# Patient Record
Sex: Female | Born: 1955
Health system: Southern US, Community
[De-identification: ages and names within clinical notes are randomized; demographics above are authoritative.]

## PROBLEM LIST (undated history)

## (undated) DIAGNOSIS — K635 Polyp of colon: Secondary | ICD-10-CM

## (undated) DIAGNOSIS — G43909 Migraine, unspecified, not intractable, without status migrainosus: Secondary | ICD-10-CM

## (undated) DIAGNOSIS — L718 Other rosacea: Secondary | ICD-10-CM

## (undated) DIAGNOSIS — E785 Hyperlipidemia, unspecified: Secondary | ICD-10-CM

## (undated) DIAGNOSIS — Z8349 Family history of other endocrine, nutritional and metabolic diseases: Secondary | ICD-10-CM

## (undated) DIAGNOSIS — E876 Hypokalemia: Secondary | ICD-10-CM

## (undated) DIAGNOSIS — K760 Fatty (change of) liver, not elsewhere classified: Secondary | ICD-10-CM

## (undated) DIAGNOSIS — I10 Essential (primary) hypertension: Secondary | ICD-10-CM

## (undated) DIAGNOSIS — R002 Palpitations: Secondary | ICD-10-CM

## (undated) HISTORY — DX: Family history of other endocrine, nutritional and metabolic diseases: Z83.49

## (undated) HISTORY — DX: Polyp of colon: K63.5

## (undated) HISTORY — DX: Palpitations: R00.2

## (undated) HISTORY — DX: Other rosacea: L71.8

## (undated) HISTORY — DX: Hyperlipidemia, unspecified: E78.5

## (undated) HISTORY — PX: GALLBLADDER SURGERY: SHX652

## (undated) HISTORY — PX: WISDOM TOOTH EXTRACTION: SHX21

## (undated) HISTORY — DX: Hypokalemia: E87.6

## (undated) HISTORY — PX: TONSILLECTOMY AND ADENOIDECTOMY: SUR1326

## (undated) HISTORY — DX: Fatty (change of) liver, not elsewhere classified: K76.0

## (undated) HISTORY — PX: OTHER SURGICAL HISTORY: SHX169

## (undated) HISTORY — DX: Essential (primary) hypertension: I10

## (undated) HISTORY — DX: Migraine, unspecified, not intractable, without status migrainosus: G43.909

---

## 2011-12-31 ENCOUNTER — Emergency Department: Payer: Self-pay | Admitting: Emergency Medicine

## 2011-12-31 LAB — CBC
HCT: 37.2 % (ref 35.0–47.0)
HGB: 12.7 g/dL (ref 12.0–16.0)
RBC: 4.16 10*6/uL (ref 3.80–5.20)
RDW: 13.2 % (ref 11.5–14.5)
WBC: 8.7 10*3/uL (ref 3.6–11.0)

## 2011-12-31 LAB — COMPREHENSIVE METABOLIC PANEL
Calcium, Total: 8.9 mg/dL (ref 8.5–10.1)
Chloride: 106 mmol/L (ref 98–107)
Co2: 27 mmol/L (ref 21–32)
Creatinine: 0.85 mg/dL (ref 0.60–1.30)
EGFR (Non-African Amer.): >60
Sodium: 139 mmol/L (ref 136–145)

## 2011-12-31 LAB — TROPONIN I: Troponin-I: <0.02 ng/mL

## 2011-12-31 LAB — CK TOTAL AND CKMB (NOT AT ARMC): CK, Total: 117 U/L (ref 21–215)

## 2013-06-19 ENCOUNTER — Ambulatory Visit: Payer: Self-pay | Admitting: Podiatry

## 2013-06-19 ENCOUNTER — Encounter: Payer: Self-pay | Admitting: *Deleted

## 2013-06-29 ENCOUNTER — Ambulatory Visit: Payer: BC Managed Care – PPO | Admitting: Podiatry

## 2014-02-07 ENCOUNTER — Ambulatory Visit: Payer: BC Managed Care – PPO | Admitting: Podiatry

## 2014-03-07 ENCOUNTER — Ambulatory Visit: Payer: Self-pay | Admitting: Family Medicine

## 2014-03-28 LAB — HM COLONOSCOPY

## 2014-05-14 ENCOUNTER — Encounter: Payer: Self-pay | Admitting: Podiatry

## 2014-05-14 ENCOUNTER — Ambulatory Visit (INDEPENDENT_AMBULATORY_CARE_PROVIDER_SITE_OTHER): Payer: No Typology Code available for payment source | Admitting: Podiatry

## 2014-05-14 ENCOUNTER — Ambulatory Visit (INDEPENDENT_AMBULATORY_CARE_PROVIDER_SITE_OTHER): Payer: No Typology Code available for payment source

## 2014-05-14 VITALS — BP 161/109 | HR 72 | Resp 16 | Ht 67.0 in | Wt 175.0 lb

## 2014-05-14 DIAGNOSIS — M722 Plantar fascial fibromatosis: Secondary | ICD-10-CM

## 2014-05-14 MED ORDER — METHYLPREDNISOLONE (PAK) 4 MG PO TABS: ORAL_TABLET | ORAL | Status: DC

## 2014-05-14 MED ORDER — DICLOFENAC SODIUM 75 MG PO TBEC: 75.0000 mg | DELAYED_RELEASE_TABLET | Freq: Two times a day (BID) | ORAL | Status: DC

## 2014-05-14 NOTE — Progress Notes (Signed)
She presents today complaining of painful right heel. It is been bothered her since July and after she returns from [place] its has seemed to progressively get worse. I have reviewed her past medical history medications allergies surgeries social history. She is done nothing for it.  Objective: Vital signs are stable alert and oriented x3. She has pain on palpation medial calcaneal tubercle of the right heel. Pulses remain palpable. Areas warm to the touch. Radiographic evaluation demonstrates a plantar distally oriented calcaneal heel spur with soft tissue increase in density at the plantar fascial calcaneal insertion site of the right heel. Consistent with plantar fasciitis.  Assessment: Plantar fasciitis right foot.  Plan: Discussed etiology pathology conservative versus surgical therapies. Injected the right heel. She will wear her night splint on the right foot. Dispensed a plantar fascial brace. Discussed appropriate shoe gear stretching exercises and ice therapy. We also dispensed a prescription for Medrol Dosepak to be followed by her diclofenac. I will followup with her in one month.

## 2014-06-13 ENCOUNTER — Encounter: Payer: Self-pay | Admitting: Podiatry

## 2014-06-13 ENCOUNTER — Ambulatory Visit (INDEPENDENT_AMBULATORY_CARE_PROVIDER_SITE_OTHER): Payer: No Typology Code available for payment source | Admitting: Podiatry

## 2014-06-13 DIAGNOSIS — M722 Plantar fascial fibromatosis: Secondary | ICD-10-CM

## 2014-06-13 NOTE — Progress Notes (Signed)
She presents today for followup of her plantar fasciitis of her right heel.  Objective: Vital signs are stable she is alert and oriented x3 he still has tenderness on palpation medial calcaneal tubercle of the right heel. She has more tenderness now on these the insertion site of the tendo Achilles and posterior aspect of the heel as it inserts posterior and inferior.  Assessment: Plantar fasciitis with compensatory tendo Achilles tendinitis right.  Plan: Continue all conservative therapies we dispensed a plantar fascial brace and a night splint. Injected her right heel once again today and she will continue diclofenac.

## 2014-06-18 ENCOUNTER — Ambulatory Visit: Payer: No Typology Code available for payment source | Admitting: Podiatry

## 2014-07-02 ENCOUNTER — Telehealth: Payer: Self-pay | Admitting: *Deleted

## 2014-07-02 ENCOUNTER — Other Ambulatory Visit: Payer: Self-pay | Admitting: *Deleted

## 2014-07-02 MED ORDER — METHYLPREDNISOLONE (PAK) 4 MG PO TABS: ORAL_TABLET | ORAL | Status: DC

## 2014-07-02 NOTE — Telephone Encounter (Signed)
Patient is going to [place] would like prednisone ,ok per hyatt

## 2014-07-02 NOTE — Telephone Encounter (Signed)
Patient is going away to [place] wanted a refill on prednisone. Ok per dr Milinda Pointer

## 2014-07-30 ENCOUNTER — Ambulatory Visit (INDEPENDENT_AMBULATORY_CARE_PROVIDER_SITE_OTHER): Payer: No Typology Code available for payment source | Admitting: Podiatry

## 2014-07-30 VITALS — BP 151/91 | HR 73 | Resp 16

## 2014-07-30 DIAGNOSIS — M722 Plantar fascial fibromatosis: Secondary | ICD-10-CM

## 2014-07-30 NOTE — Progress Notes (Signed)
He presents today for follow-up of her plantar fasciitis to her right heel. She states that the majority of the foot is doing well except it hurts right ear and she points to the posterior lateral aspect of the right heel. She denies any trauma.  Objective: Vital signs stable she is alert and oriented 3 she has pain on posterior lateral aspect of the right heel.  Assessment plantar fasciitis insertional Achilles tendinitis right.  Plan: Injected dexamethasone to the point of maximal tenderness today and she was scanned for set of orthotics.

## 2014-08-22 ENCOUNTER — Ambulatory Visit: Payer: No Typology Code available for payment source | Admitting: Podiatry

## 2014-10-15 ENCOUNTER — Ambulatory Visit (INDEPENDENT_AMBULATORY_CARE_PROVIDER_SITE_OTHER): Payer: No Typology Code available for payment source | Admitting: *Deleted

## 2014-10-15 DIAGNOSIS — M722 Plantar fascial fibromatosis: Secondary | ICD-10-CM

## 2014-10-15 NOTE — Progress Notes (Signed)
Orthotics picked up. Gradual breakin discussed.

## 2014-10-15 NOTE — Patient Instructions (Signed)

## 2014-11-14 ENCOUNTER — Ambulatory Visit: Payer: No Typology Code available for payment source | Admitting: Podiatry

## 2015-01-29 ENCOUNTER — Ambulatory Visit: Payer: Self-pay | Admitting: Family Medicine

## 2015-02-04 ENCOUNTER — Ambulatory Visit (INDEPENDENT_AMBULATORY_CARE_PROVIDER_SITE_OTHER): Payer: No Typology Code available for payment source | Admitting: Family Medicine

## 2015-02-04 ENCOUNTER — Encounter: Payer: Self-pay | Admitting: Family Medicine

## 2015-02-04 ENCOUNTER — Encounter (INDEPENDENT_AMBULATORY_CARE_PROVIDER_SITE_OTHER): Payer: Self-pay

## 2015-02-04 VITALS — BP 130/90 | HR 79 | Temp 98.9°F | Ht 67.0 in | Wt 180.8 lb

## 2015-02-04 DIAGNOSIS — M722 Plantar fascial fibromatosis: Secondary | ICD-10-CM | POA: Diagnosis not present

## 2015-02-04 DIAGNOSIS — I10 Essential (primary) hypertension: Secondary | ICD-10-CM | POA: Diagnosis not present

## 2015-02-04 DIAGNOSIS — Z8489 Family history of other specified conditions: Secondary | ICD-10-CM | POA: Diagnosis not present

## 2015-02-04 DIAGNOSIS — Z8349 Family history of other endocrine, nutritional and metabolic diseases: Secondary | ICD-10-CM

## 2015-02-04 DIAGNOSIS — L718 Other rosacea: Secondary | ICD-10-CM

## 2015-02-04 DIAGNOSIS — Z7189 Other specified counseling: Secondary | ICD-10-CM

## 2015-02-04 NOTE — Patient Instructions (Addendum)
Schedule a fasting lab appointment.   Take care.  Glad to see you.   I'll look back through your old records in the meantime.

## 2015-02-04 NOTE — Progress Notes (Signed)
Pre visit review using our clinic review tool, if applicable. No additional management support is needed unless otherwise documented below in the visit note.  New pt.    Hypertension:    Using medication without problems or lightheadedness: yes Chest pain with exertion:no Edema:no Short of breath:no Intolerant of diuretics.   No ADE on current med.  D/w pt about diet and exercise.  Outside labs noted, due for lipids.    H/o ocular rosaea, responded to doxy prev.  Off med currently.   FH of B12 def, occ word searching noted.  Asking about getting B12 checked.  This is reasonable.  D/w pt.   Meds, vitals, and allergies reviewed.   PMH and SH reviewed  ROS: See HPI.  Otherwise negative.    GEN: nad, alert and oriented HEENT: mucous membranes moist NECK: supple w/o LA CV: rrr. PULM: ctab, no inc wob ABD: soft, +bs EXT: no edema SKIN: no acute rash

## 2015-02-05 ENCOUNTER — Encounter: Payer: Self-pay | Admitting: Family Medicine

## 2015-02-05 DIAGNOSIS — I10 Essential (primary) hypertension: Secondary | ICD-10-CM | POA: Insufficient documentation

## 2015-02-05 DIAGNOSIS — Z7189 Other specified counseling: Secondary | ICD-10-CM | POA: Insufficient documentation

## 2015-02-05 DIAGNOSIS — M722 Plantar fascial fibromatosis: Secondary | ICD-10-CM | POA: Insufficient documentation

## 2015-02-05 DIAGNOSIS — L718 Other rosacea: Secondary | ICD-10-CM | POA: Insufficient documentation

## 2015-02-05 DIAGNOSIS — Z8349 Family history of other endocrine, nutritional and metabolic diseases: Secondary | ICD-10-CM | POA: Insufficient documentation

## 2015-02-05 NOTE — Assessment & Plan Note (Signed)
Return for labs.  She agrees.  Reasonable to check.

## 2015-02-05 NOTE — Assessment & Plan Note (Addendum)
Controlled, continue as is.  Will review available outside labs and records for other health maintenance items.  D/w pt.   Intolerant of diuretics.   No ADE on current med.  D/w pt about diet and exercise.  Outside labs noted, due for lipids.   >30 minutes spent in face to face time with patient, >50% spent in counselling or coordination of care

## 2015-02-07 ENCOUNTER — Other Ambulatory Visit (INDEPENDENT_AMBULATORY_CARE_PROVIDER_SITE_OTHER): Payer: No Typology Code available for payment source

## 2015-02-07 DIAGNOSIS — Z8489 Family history of other specified conditions: Secondary | ICD-10-CM

## 2015-02-07 DIAGNOSIS — I10 Essential (primary) hypertension: Secondary | ICD-10-CM | POA: Diagnosis not present

## 2015-02-07 DIAGNOSIS — Z8349 Family history of other endocrine, nutritional and metabolic diseases: Secondary | ICD-10-CM

## 2015-02-07 LAB — LIPID PANEL
CHOLESTEROL: 218 mg/dL — AB (ref 0–200)
HDL: 49.8 mg/dL (ref >39.00)
LDL CALC: 137 mg/dL — AB (ref 0–99)
NonHDL: 168.2
Total CHOL/HDL Ratio: 4
Triglycerides: 157 mg/dL — ABNORMAL HIGH (ref 0.0–149.0)
VLDL: 31.4 mg/dL (ref 0.0–40.0)

## 2015-02-07 LAB — VITAMIN B12: VITAMIN B 12: 1393 pg/mL — AB (ref 211–911)

## 2015-02-11 ENCOUNTER — Ambulatory Visit (INDEPENDENT_AMBULATORY_CARE_PROVIDER_SITE_OTHER): Payer: No Typology Code available for payment source | Admitting: Family Medicine

## 2015-02-11 DIAGNOSIS — Z8349 Family history of other endocrine, nutritional and metabolic diseases: Secondary | ICD-10-CM

## 2015-02-11 DIAGNOSIS — Z8489 Family history of other specified conditions: Secondary | ICD-10-CM

## 2015-02-12 NOTE — Progress Notes (Signed)
Opened in error

## 2015-02-26 ENCOUNTER — Telehealth: Payer: Self-pay

## 2015-02-26 NOTE — Telephone Encounter (Signed)
Please get update on patient.  Thanks. 

## 2015-02-26 NOTE — Telephone Encounter (Signed)
Do not see where pt was seen at [facility] Sat clinic.

## 2015-02-26 NOTE — Telephone Encounter (Signed)
No answer, no VM available.  Will try again later. 

## 2015-02-26 NOTE — Telephone Encounter (Signed)
No answer, no VM available.

## 2015-02-26 NOTE — Telephone Encounter (Signed)
PLEASE NOTE: All timestamps contained within this report are represented as [place] Standard Time. CONFIDENTIALTY NOTICE: This fax transmission is intended only for the addressee. It contains information that is legally privileged, confidential or otherwise protected from use or disclosure. If you are not the intended recipient, you are strictly prohibited from reviewing, disclosing, copying using or disseminating any of this information or taking any action in reliance on or regarding this information. If you have received this fax in error, please notify us immediately by telephone so that we can arrange for its return to [place]. Phone: [phone], Toll-Free: [phone], Fax: [phone] Page: 1 of 2 Call Id: [id number] Clarence Patient Name: Yolanda Delgado Gender: Female DOB: 1956-07-10 Age: 59 Y 10 M 13 D Return Phone Number: [phone] (Primary) Address: City/State/Zip: [place] [address] Client [facility] - Client Client Site [place] Physician Renford Dills Contact Type Call Call Type Triage / Clinical Relationship To Patient Self Return Phone Number [phone] (Primary) Chief Complaint Tick Bite Initial Comment Caller states she had a tick on her and there is a swollen red area around it. PreDisposition Call Doctor Nurse Assessment Nurse: Leilani Merl, RN, Nira Conn Date/Time Eilene Ghazi Time): 02/23/2015 9:43:54 AM Confirm and document reason for call. If symptomatic, describe symptoms. ---Caller states she had a tick on her, that might have been there for about a week, her husband removed it this morning and there is a swollen red area around it that is about a nickel size Has the patient traveled out of the country within the last 30 days? ---Not Applicable Does the patient require triage? ---Yes Related visit to physician  within the last 2 weeks? ---No Does the PT have any chronic conditions? (i.e. diabetes, asthma, etc.) ---Unknown Guidelines Guideline Title Affirmed Question Affirmed Notes Nurse Date/Time (Eastern Time) Tick Bite [1] Probable deer tick AND [2] attached > 24 hours (or tick appears swollen, not flat) AND [3] occurred in an area where Lyme disease is common Standifer, RN, Heather 02/23/2015 9:45:43 AM Disp. Time Eilene Ghazi Time) Disposition Final User 02/23/2015 9:54:40 AM Call Completed Standifer, RN, Nira Conn 02/23/2015 9:53:22 AM Call PCP within 24 Hours Yes Standifer, RN, Soyla Murphy Understands: Yes Disagree/Comply: Comply PLEASE NOTE: All timestamps contained within this report are represented as [place] Standard Time. CONFIDENTIALTY NOTICE: This fax transmission is intended only for the addressee. It contains information that is legally privileged, confidential or otherwise protected from use or disclosure. If you are not the intended recipient, you are strictly prohibited from reviewing, disclosing, copying using or disseminating any of this information or taking any action in reliance on or regarding this information. If you have received this fax in error, please notify us immediately by telephone so that we can arrange for its return to [place]. Phone: [phone], Toll-Free: [phone], Fax: [phone] Page: 2 of 2 Call Id: [id number] Care Advice Given Per Guideline CALL PCP WITHIN 24 HOURS: You need to discuss this with your doctor within the next 24 hours. * You become worse. CALL BACK IF: CARE ADVICE given per Tick Bites (Adult) guideline. * Fever or rash occur in the next 2 weeks After Care Instructions Given Call Event Type User Date / Time Description Referrals [facility]

## 2015-02-27 NOTE — Telephone Encounter (Signed)
Spoke to patient and was advised that she went to [facility] in [place]. Patient stated that they gave her Doxycyline 100 mg twice a day for 14 days.  Patient stated that she is doing better and they think that it was a lone star tick instead of a deer tick. Patient stated that the tick may have been in for at least 5 days. Advised patient that if she does not continue to improve or develops any symptoms to call for an appointment to be seen. Patient agreed.

## 2015-02-27 NOTE — Telephone Encounter (Signed)
Thanks

## 2016-11-16 DIAGNOSIS — H524 Presbyopia: Secondary | ICD-10-CM | POA: Diagnosis not present

## 2017-02-03 ENCOUNTER — Telehealth: Payer: Self-pay

## 2017-02-03 ENCOUNTER — Encounter: Payer: Self-pay | Admitting: Family Medicine

## 2017-02-03 ENCOUNTER — Ambulatory Visit (INDEPENDENT_AMBULATORY_CARE_PROVIDER_SITE_OTHER): Payer: 59 | Admitting: Family Medicine

## 2017-02-03 VITALS — BP 170/110 | HR 74 | Temp 98.8°F | Wt 184.2 lb

## 2017-02-03 DIAGNOSIS — Z8349 Family history of other endocrine, nutritional and metabolic diseases: Secondary | ICD-10-CM

## 2017-02-03 DIAGNOSIS — I10 Essential (primary) hypertension: Secondary | ICD-10-CM | POA: Diagnosis not present

## 2017-02-03 DIAGNOSIS — G43909 Migraine, unspecified, not intractable, without status migrainosus: Secondary | ICD-10-CM | POA: Diagnosis not present

## 2017-02-03 MED ORDER — IRBESARTAN 150 MG PO TABS: 150.0000 mg | ORAL_TABLET | Freq: Every day | ORAL | 3 refills | Status: DC

## 2017-02-03 MED ORDER — SUMATRIPTAN SUCCINATE 50 MG PO TABS: 50.0000 mg | ORAL_TABLET | Freq: Every day | ORAL | 3 refills | Status: DC | PRN

## 2017-02-03 MED ORDER — ATORVASTATIN CALCIUM 10 MG PO TABS: 10.0000 mg | ORAL_TABLET | Freq: Every day | ORAL | 3 refills | Status: DC

## 2017-02-03 MED ORDER — AMLODIPINE BESYLATE 2.5 MG PO TABS: 2.5000 mg | ORAL_TABLET | Freq: Every day | ORAL | 3 refills | Status: DC

## 2017-02-03 MED ORDER — AMLODIPINE-ATORVASTATIN 2.5-10 MG PO TABS: 1.0000 | ORAL_TABLET | Freq: Every day | ORAL | 3 refills | Status: DC

## 2017-02-03 NOTE — Progress Notes (Signed)
Hypertension:    Using medication without problems or lightheadedness: yes Chest pain with exertion:no Edema:no Short of breath:no She is out of meds.   She has tolerated ARB w/o complication.   Due for labs. D/w pt.   Usually BP controlled, systolic 507 or lower.    FH b12 def, off replacement.  She doesn't have known personal hx.    Migraines- avoiding tylenol.  imitrex helped some.  Used prn.  No ADE on med.   She has had recurrent trouble with right hip bursitis and I asked her to speak with the front staff about seeing Dr. Lorelei Pont.   PMH and SH reviewed  ROS: Per HPI unless specifically indicated in ROS section   Meds, vitals, and allergies reviewed.   GEN: nad, alert and oriented HEENT: mucous membranes moist NECK: supple w/o LA CV: rrr. PULM: ctab, no inc wob ABD: soft, +bs EXT: no edema SKIN: no acute rash

## 2017-02-03 NOTE — Telephone Encounter (Signed)
Pt is at [facility] now; pt request cb ASAP; pts BP is high and she does not have amlodipine to take this evening. Pt said can split med back up again. I advised was not sure if PA would need to be done or how will be resolved. Pt does not want to go without med this evening.Please advise.

## 2017-02-03 NOTE — Telephone Encounter (Signed)
Patient notified by telephone that scripts were sent in per Dr. Damita Dunnings.

## 2017-02-03 NOTE — Telephone Encounter (Signed)
Sent as separate rxs.

## 2017-02-03 NOTE — Patient Instructions (Addendum)
Go to the lab on the way out.  We'll contact you with your lab report. Take care.  Glad to see you.  Update me if your BP isn't controlled when back on the meds, goal <140/<90.

## 2017-02-04 LAB — LIPID PANEL
Cholesterol: 250 mg/dL — ABNORMAL HIGH (ref 0–200)
HDL: 50.1 mg/dL (ref >39.00)
NonHDL: 199.69
TRIGLYCERIDES: 215 mg/dL — AB (ref 0.0–149.0)
Total CHOL/HDL Ratio: 5
VLDL: 43 mg/dL — ABNORMAL HIGH (ref 0.0–40.0)

## 2017-02-04 LAB — LDL CHOLESTEROL, DIRECT: LDL DIRECT: 164 mg/dL

## 2017-02-04 LAB — COMPREHENSIVE METABOLIC PANEL
ALK PHOS: 60 U/L (ref 39–117)
ALT: 46 U/L — AB (ref 0–35)
AST: 25 U/L (ref 0–37)
Albumin: 4.7 g/dL (ref 3.5–5.2)
BILIRUBIN TOTAL: 1.2 mg/dL (ref 0.2–1.2)
BUN: 23 mg/dL (ref 6–23)
CALCIUM: 10.1 mg/dL (ref 8.4–10.5)
CO2: 27 meq/L (ref 19–32)
Chloride: 104 mEq/L (ref 96–112)
Creatinine, Ser: 0.8 mg/dL (ref 0.40–1.20)
GFR: 78.08 mL/min (ref >60.00)
Glucose, Bld: 81 mg/dL (ref 70–99)
Potassium: 4.2 mEq/L (ref 3.5–5.1)
Sodium: 140 mEq/L (ref 135–145)
TOTAL PROTEIN: 7.4 g/dL (ref 6.0–8.3)

## 2017-02-04 LAB — VITAMIN B12: Vitamin B-12: 401 pg/mL (ref 211–911)

## 2017-02-05 DIAGNOSIS — G43909 Migraine, unspecified, not intractable, without status migrainosus: Secondary | ICD-10-CM | POA: Insufficient documentation

## 2017-02-05 NOTE — Assessment & Plan Note (Signed)
Per patient, had been controlled. Due for labs. See notes on labs. Rationale for current medications discussed with patient. Reasonable to restart medication and have her update me if her blood pressure not controlled. Discussed with patient. >25 minutes spent in face to face time with patient, >50% spent in counselling or coordination of care.

## 2017-02-05 NOTE — Assessment & Plan Note (Signed)
See notes on labs. Not on replacement currently.

## 2017-02-05 NOTE — Assessment & Plan Note (Signed)
Is avoiding tylenol to limit rebound HA.  imitrex helped some.  Used prn.  No ADE on med. Continue prn use, as is.

## 2017-02-07 ENCOUNTER — Other Ambulatory Visit: Payer: Self-pay | Admitting: Family Medicine

## 2017-02-07 ENCOUNTER — Encounter: Payer: Self-pay | Admitting: Family Medicine

## 2017-02-07 DIAGNOSIS — E785 Hyperlipidemia, unspecified: Secondary | ICD-10-CM | POA: Insufficient documentation

## 2017-02-22 ENCOUNTER — Ambulatory Visit: Payer: 59 | Admitting: Family Medicine

## 2017-03-10 ENCOUNTER — Encounter: Payer: Self-pay | Admitting: Family Medicine

## 2017-03-10 ENCOUNTER — Ambulatory Visit (INDEPENDENT_AMBULATORY_CARE_PROVIDER_SITE_OTHER): Payer: 59 | Admitting: Family Medicine

## 2017-03-10 VITALS — BP 140/90 | HR 74 | Temp 99.0°F | Ht 66.73 in | Wt 183.0 lb

## 2017-03-10 DIAGNOSIS — M545 Low back pain, unspecified: Secondary | ICD-10-CM

## 2017-03-10 DIAGNOSIS — M7061 Trochanteric bursitis, right hip: Secondary | ICD-10-CM

## 2017-03-10 DIAGNOSIS — M722 Plantar fascial fibromatosis: Secondary | ICD-10-CM

## 2017-03-10 DIAGNOSIS — M25372 Other instability, left ankle: Secondary | ICD-10-CM

## 2017-03-10 DIAGNOSIS — G8929 Other chronic pain: Secondary | ICD-10-CM

## 2017-03-10 NOTE — Progress Notes (Signed)
Dr. Frederico Hamman T. Fariha Goto, MD, [facility] [place], [address] Phone: [phone] Fax: [phone]  03/10/2017  Patient: Yolanda Delgado, MRN: [id number], DOB: 1956-08-10, 61 y.o.  Primary Physician:  Tonia Ghent, MD   Chief Complaint  Patient presents with  . Hip Pain    Right  . Back Pain    thinks it's muscular  . Plantar Fasciitis    history of both feet at two different times   Subjective:   Yolanda Delgado is a 61 y.o. very pleasant female patient who presents with the following:  Patient presents primarily with lateral hip pain on the right, but she has multiple other musculoskeletal complaints.  4-5 yrs ago, PF on the L, then had troch bursitis - has had several injections for the last 3-4 years. At the moment it is not hurting. Takes some diclofenac for several days.   Prior to this, for 5 years ago she was very active and can walk, swim, and hike routinely. Now she is fairly limited. She also has intermittent back pain in the low back with walking, standing, and sitting. She does not have any numbness, tingling or radicular symptoms.  She denies any groin pain.  She also had plantar fasciitis in both the left and right foot intermittently, but they are improved at this point. She also has currently some pain in the ankle joint on the left.  Walking, swimming will get pain laterrally   Past Medical History, Surgical History, Social History, Family History, Problem List, Medications, and Allergies have been reviewed and updated if relevant.  Patient Active Problem List   Diagnosis Date Noted  . HLD (hyperlipidemia) 02/07/2017  . Migraine 02/05/2017  . HTN (hypertension) 02/05/2015  . Plantar fasciitis 02/05/2015  . Ocular rosacea 02/05/2015  . Advance care planning 02/05/2015    Past Medical History:  Diagnosis Date  . Colon polyp   . Family history of B12 deficiency   . Hypertension   .  Hypokalemia    with diuretic use  . Migraine   . Ocular rosacea   . Palpitation    due to caffeine    Past Surgical History:  Procedure Laterality Date  . GALLBLADDER SURGERY    . TONSILLECTOMY AND ADENOIDECTOMY    . UTERINE ABLATION    . WISDOM TOOTH EXTRACTION      Social History   Social History  . Marital status: Married    Spouse name: N/A  . Number of children: N/A  . Years of education: N/A   Occupational History  . Not on file.   Social History Main Topics  . Smoking status: Never Smoker  . Smokeless tobacco: Never Used  . Alcohol use No  . Drug use: No  . Sexual activity: Not on file   Other Topics Concern  . 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 Renal cancer Mother   . Breast cancer Mother   . Cancer Mother   . Stroke Father     Allergies  Allergen Reactions  . Ace Inhibitors Other (See Comments)    H/o leg cramping with ACE use.   Marland Kitchen Hydrochlorothiazide Other (See Comments)    Risk of hypokalemia- prev hospitalized.    Marland Kitchen  Lasix [Furosemide] Other (See Comments)    Risk of hypokalemia- prev hospitalized.  . Pamabrom Other (See Comments)    Low potassium  . Aspirin Other (See Comments)    Local swelling at the temples, but can tolerate ibuprofen and diclofenac    Medication list reviewed and updated in full in [facility].  GEN: No fevers, chills. Nontoxic. Primarily MSK c/o today. MSK: Detailed in the HPI GI: tolerating PO intake without difficulty Neuro: No numbness, parasthesias, or tingling associated. Otherwise the pertinent positives of the ROS are noted above.   Objective:   BP 140/90   Pulse 74   Temp 99 F (37.2 C) (Oral)   Ht 5' 6.73" (1.695 m)   Wt 183 lb (83 kg)   BMI 28.89 kg/m    GEN: WDWN, NAD, Non-toxic, Alert & Oriented x 3 HEENT:  Atraumatic, Normocephalic.  Ears and Nose: No external deformity. EXTR: No clubbing/cyanosis/edema NEURO: Normal gait.  PSYCH: Normally interactive. Conversant. Not depressed or anxious appearing.  Calm demeanor.   HIP EXAM: SIDE: B ROM: Abduction, Flexion, Internal and External range of motion: full Pain with terminal IROM and EROM: none GTB: NT SLR: NEG Knees: No effusion FABER: NT REVERSE FABER: NT, neg Piriformis: NT at direct palpation Str: flexion: 4-/5 abduction: 3/5 on R and 4+/5 on L adduction: 3+/5 Strength testing non-tender  Left ankle has some tenderness in the true ankle joint above the talus. She also has some increased laxity on her anterior drawer testing. Subtalar tilt test is normal.  Nontender throughout the forefoot and midfoot.  She also has some weakness in dorsiflexion as well as inversion and eversion on the foot and ankle. Toe extension and plantar flexion are 5/5.  Radiology: No results found.  Assessment and Plan:   Trochanteric bursitis, right hip - Plan: Ambulatory referral to Physical Therapy  Chronic bilateral low back pain without sciatica - Plan: Ambulatory referral to Physical Therapy  Left ankle instability - Plan: Ambulatory referral to Physical Therapy  Plantar fasciitis, bilateral  Globally she is quite weak, particularly in her pelvic support structure. Without stabilizing this and increasing her strength she will have recurrent trochanteric bursitis and other issues around the hip.  Left ankle is modestly unstable. Decreased strength. Today with some inflammation in the ankle joint itself.  I have given her a fairly thorough hip rehabilitation program as well as some ankle proprioceptive exercises.  Formal physical therapy I think will be very helpful in decreasing further risk for recurrent pain and injury.  Follow-up: Return in about 6 weeks (around 04/21/2017).  Future Appointments Date Time Provider [place]    04/28/2017 11:00 AM Owens Loffler, MD [facility]  05/11/2017 10:30 AM [facility]   Orders Placed This Encounter  Procedures  . Ambulatory referral to Physical Therapy    Signed,  Frederico Hamman T. Malasha Kleppe, MD   Allergies as of 03/10/2017      Reactions   Ace Inhibitors Other (See Comments)   H/o leg cramping with ACE use.    Hydrochlorothiazide Other (See Comments)   Risk of hypokalemia- prev hospitalized.     Lasix [furosemide] Other (See Comments)   Risk of hypokalemia- prev hospitalized.   Pamabrom Other (See Comments)   Low potassium   Aspirin Other (See Comments)   Local swelling at the temples, but can tolerate ibuprofen and diclofenac      Medication List       Accurate as of 03/10/17  1:40 PM. Always  use your most recent med list.          amLODipine 2.5 MG tablet Commonly known as:  NORVASC Take 1 tablet (2.5 mg total) by mouth daily.   atorvastatin 10 MG tablet Commonly known as:  LIPITOR Take 1 tablet (10 mg total) by mouth daily.   diclofenac 75 MG EC tablet Commonly known as:  VOLTAREN as needed.   ESTROVEN PO Take by mouth daily.   Ginger Root 250 MG Caps Takes 1 capsules daily   irbesartan 150 MG tablet Commonly known as:  AVAPRO Take 1 tablet (150 mg total) by mouth daily.   NON FORMULARY Sea [place]orn Oil daily.   SUMAtriptan 50 MG tablet Commonly known as:  IMITREX Take 1 tablet (50 mg total) by mouth daily as needed for migraine. May repeat in 2 hours if headache persists or recurs.   TURMERIC PO Take by mouth daily.

## 2017-03-15 ENCOUNTER — Telehealth: Payer: Self-pay

## 2017-03-15 NOTE — Telephone Encounter (Signed)
Refaxed orders to [place] office.

## 2017-03-15 NOTE — Telephone Encounter (Signed)
Pt was referred to them and they did not receive a demographics sheet or script for PT. She has an appt tomorrow and needs it faxed to [phone].

## 2017-03-16 DIAGNOSIS — M25551 Pain in right hip: Secondary | ICD-10-CM | POA: Diagnosis not present

## 2017-03-16 DIAGNOSIS — M7061 Trochanteric bursitis, right hip: Secondary | ICD-10-CM | POA: Diagnosis not present

## 2017-03-18 DIAGNOSIS — M25551 Pain in right hip: Secondary | ICD-10-CM | POA: Diagnosis not present

## 2017-03-18 DIAGNOSIS — M7061 Trochanteric bursitis, right hip: Secondary | ICD-10-CM | POA: Diagnosis not present

## 2017-03-22 ENCOUNTER — Telehealth: Payer: Self-pay | Admitting: *Deleted

## 2017-03-22 DIAGNOSIS — M25551 Pain in right hip: Secondary | ICD-10-CM | POA: Diagnosis not present

## 2017-03-22 DIAGNOSIS — M7061 Trochanteric bursitis, right hip: Secondary | ICD-10-CM | POA: Diagnosis not present

## 2017-03-22 NOTE — Telephone Encounter (Signed)
Patient left a voicemail stating that she is calling to let you know that her blood pressure is not getting below 140/90-95. Patient stated that she does not know if you want her to do something else about her BP or not? Pharmacy [facility]

## 2017-03-23 ENCOUNTER — Ambulatory Visit (INDEPENDENT_AMBULATORY_CARE_PROVIDER_SITE_OTHER): Payer: 59 | Admitting: Primary Care

## 2017-03-23 ENCOUNTER — Other Ambulatory Visit: Payer: Self-pay | Admitting: *Deleted

## 2017-03-23 ENCOUNTER — Encounter: Payer: Self-pay | Admitting: Primary Care

## 2017-03-23 VITALS — BP 152/98 | HR 84 | Temp 98.7°F | Wt 181.8 lb

## 2017-03-23 DIAGNOSIS — J029 Acute pharyngitis, unspecified: Secondary | ICD-10-CM | POA: Diagnosis not present

## 2017-03-23 LAB — POCT RAPID STREP A (OFFICE): Rapid Strep A Screen: NEGATIVE

## 2017-03-23 MED ORDER — AMLODIPINE BESYLATE 5 MG PO TABS: 5.0000 mg | ORAL_TABLET | Freq: Every day | ORAL | 11 refills | Status: DC

## 2017-03-23 NOTE — Telephone Encounter (Signed)
Patient advised.  New Rx sent.

## 2017-03-23 NOTE — Patient Instructions (Signed)
Your symptoms are representative of a viral illness which will resolve on its own over time. Our goal is to treat your symptoms in order to aid your body in the healing process and to make you more comfortable.   Please call me Friday this week if your pain becomes worse, you develop fevers, you notice white spots.  Start Ibuprofen. Take 600 mg three times daily as needed for pain and inflammation.  Try warm salt gargles for pain.  It was a pleasure meeting you!

## 2017-03-23 NOTE — Telephone Encounter (Signed)
Call patient. Verify that she is on amlodipine 2.5 mg a day. If so, increase to 5 mg a day and then update me as needed about her blood pressure, if still above 140/90. Let me know if she needs a new prescription sent. Thanks.

## 2017-03-23 NOTE — Progress Notes (Signed)
Subjective:    Patient ID: Yolanda Delgado, female    DOB: [birth year], 61 y.o.   MRN: [id number]  HPI  Yolanda Delgado is a 61 year old female who presents today with a chief complaint of sore throat. She also reports chills, cough, voice hoarseness. Her symptoms began three days ago. She denies sick contacts, fevers, sinus pressure. She's taken Ricola lozenges, Zicam, Zinc, Vitamin D 3, choloraseptic spray without much improvement.   Review of Systems  Constitutional: Positive for chills. Negative for fever.  HENT: Positive for congestion and sore throat. Negative for sinus pressure.   Respiratory: Positive for cough.      Past Medical History:  Diagnosis Date  . Colon polyp   . Family history of B12 deficiency   . Hypertension   . Hypokalemia    with diuretic use  . Migraine   . Ocular rosacea   . Palpitation    due to caffeine     Social History   Social History  . Marital status: Married    Spouse name: N/A  . Number of children: N/A  . Years of education: N/A   Occupational History  . Not on file.   Social History Main Topics  . Smoking status: Never Smoker  . Smokeless tobacco: Never Used  . Alcohol use No  . Drug use: No  . Sexual activity: Not on file   Other Topics Concern  . 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 GALLBLADDER SURGERY    . TONSILLECTOMY AND ADENOIDECTOMY    . UTERINE ABLATION    . WISDOM TOOTH EXTRACTION      Family History  Problem Relation Age of Onset  . Renal cancer Mother   . Breast cancer Mother   . Cancer Mother   . Stroke Father     Allergies  Allergen Reactions  . Ace Inhibitors Other (See Comments)    H/o leg cramping with ACE use.   Marland Kitchen Hydrochlorothiazide Other (See Comments)    Risk of hypokalemia- prev hospitalized.    . Lasix [Furosemide]  Other (See Comments)    Risk of hypokalemia- prev hospitalized.  . Pamabrom Other (See Comments)    Low potassium  . Aspirin Other (See Comments)    Local swelling at the temples, but can tolerate ibuprofen and diclofenac    Current Outpatient Prescriptions on File Prior to Visit  Medication Sig Dispense Refill  . atorvastatin (LIPITOR) 10 MG tablet Take 1 tablet (10 mg total) by mouth daily. 90 tablet 3  . diclofenac (VOLTAREN) 75 MG EC tablet as needed.     . Ginger, Zingiber officinalis, (GINGER ROOT) 250 MG CAPS Takes 1 capsules daily    . irbesartan (AVAPRO) 150 MG tablet Take 1 tablet (150 mg total) by mouth daily. 90 tablet 3  . NON FORMULARY Sea Buckthorn Oil daily.    . Nutritional Supplements (ESTROVEN PO) Take by mouth daily.    . SUMAtriptan (IMITREX) 50 MG tablet Take 1 tablet (50 mg total) by mouth daily as needed for migraine. May repeat in 2 hours if headache persists or recurs. 10 tablet 3  . TURMERIC PO Take by mouth daily.     No current facility-administered medications on file prior to visit.  BP (!) 152/98   Pulse 84   Temp 98.7 F (37.1 C) (Oral)   Wt 181 lb 12.8 oz (82.5 kg)   SpO2 99%   BMI 28.70 kg/m    Objective:   Physical Exam  Constitutional: She appears well-nourished. She does not appear ill.  HENT:  Right Ear: Tympanic membrane and ear canal normal.  Left Ear: Ear canal normal. Tympanic membrane is retracted. Tympanic membrane is not erythematous.  Nose: Right sinus exhibits no maxillary sinus tenderness and no frontal sinus tenderness. Left sinus exhibits no maxillary sinus tenderness and no frontal sinus tenderness.  Mouth/Throat: Posterior oropharyngeal edema and posterior oropharyngeal erythema present. No oropharyngeal exudate.  Eyes: Conjunctivae are normal.  Neck: Neck supple.  Cardiovascular: Normal rate and regular rhythm.   Pulmonary/Chest: Effort normal and breath sounds normal. She has no wheezes. She has no rales.    Lymphadenopathy:    She has no cervical adenopathy.  Skin: Skin is warm and dry.          Assessment & Plan:  Sore Throat:  Also with congestion, mild cough, chills x 3 days. Little to no improvement with OTC treatment. Rapid Strep: Negative Suspect viral involvement and will treat with supportive measures. Ibuprofen, warm salt gargles. She will update Friday this week if no improvement.  Sheral Flow, NP

## 2017-03-25 ENCOUNTER — Telehealth: Payer: Self-pay

## 2017-03-25 DIAGNOSIS — R05 Cough: Secondary | ICD-10-CM

## 2017-03-25 DIAGNOSIS — R059 Cough, unspecified: Secondary | ICD-10-CM

## 2017-03-25 NOTE — Telephone Encounter (Signed)
Patient says she doesn't really think she needs an x-ray unless it moves down into her chest.  Right now it is mainly her nose, eyes and throat.  Throat has been sore for a week, nasal congestion, productive cough this morning of green phlegm but not since early morning.  Eyes are weeping and swollen.  Eye MD suggested that ABX might be needed.

## 2017-03-25 NOTE — Telephone Encounter (Signed)
Please have her take the eyedrops as prescribed by her optometrist. I still suspect her respiratory symptoms to be viral, see below. We can check a chest x-ray to rule out any potential bacterial involvement, this will give [place] more information. Please have her come in at her convenience. Which cough medication would she like? See below.

## 2017-03-25 NOTE — Telephone Encounter (Signed)
Spoken and notified patient of Kate's comments. Patient verbalized understanding.  Patient stated that she need something. The eye doctor told her to call here. They saw possible both viral and bacterial involvement in her eye.

## 2017-03-25 NOTE — Telephone Encounter (Signed)
I'm sorry to hear she's not feeling better. Her exam on 03/23/17 was unremarkable. Viral infections can last up to 14 days. She has a 5 day history of these symptoms which are typically worse during this time period. She should notice an improvement starting on Saturday/Sunday this weekend. Would recommend she try Mucinex, Ibuprofen, warm salt gargles. I'm happy to send cough medication to her pharmacy to help with symptoms, let me know if she'd like something like Tessalon Pearls (non drowsy for cough) or cough medication with Codeine which will help with sleep at night.  Would recommend she come in for re-evaluation with PCP if she's experiencing fevers over 101.5, wheezing, increased shortness of breath. Otherwise have her update [place] Monday next week if she's getting worse.

## 2017-03-25 NOTE — Telephone Encounter (Signed)
Pt left v/m; pt was seen 03/23/17; today pt has pink eye but has appt with eye dr today; S/T is no better; pt has prod cough with green phlegm; pt feels really bad. Pt request cb . [facility].

## 2017-03-26 ENCOUNTER — Ambulatory Visit (INDEPENDENT_AMBULATORY_CARE_PROVIDER_SITE_OTHER): Payer: 59 | Admitting: Family Medicine

## 2017-03-26 ENCOUNTER — Encounter: Payer: Self-pay | Admitting: Family Medicine

## 2017-03-26 DIAGNOSIS — J0100 Acute maxillary sinusitis, unspecified: Secondary | ICD-10-CM | POA: Diagnosis not present

## 2017-03-26 MED ORDER — NYSTATIN 100000 UNIT/ML MT SUSP: 5.0000 mL | Freq: Four times a day (QID) | OROMUCOSAL | 1 refills | Status: DC

## 2017-03-26 MED ORDER — AMLODIPINE BESYLATE 5 MG PO TABS: 5.0000 mg | ORAL_TABLET | Freq: Every day | ORAL | Status: DC

## 2017-03-26 MED ORDER — LIDOCAINE VISCOUS 2 % MT SOLN: 5.0000 mL | Freq: Four times a day (QID) | OROMUCOSAL | 1 refills | Status: DC | PRN

## 2017-03-26 MED ORDER — AMOXICILLIN-POT CLAVULANATE 875-125 MG PO TABS: 1.0000 | ORAL_TABLET | Freq: Two times a day (BID) | ORAL | 0 refills | Status: DC

## 2017-03-26 NOTE — Patient Instructions (Signed)
Rest and fluids, lidocaine and nystatin by mouth, start augmentin, update [place] as needed.  Take care.  Glad to see you.

## 2017-03-26 NOTE — Progress Notes (Signed)
She was recently seen in clinic here, then here eyes got worse.  Had been seen by eye clinic and was treated for pinkeye in the meantime.  Here eyes are better in the meantime.  t max ~99.3, no vomiting, no diarrhea.  Some nausea earlier today, not now.  Pain with swallow.  Tongue is white, felt irritated today.  Voice is still hoarse.  She is still able to handle her secretions.  Meds, vitals, and allergies reviewed.   ROS: Per HPI unless specifically indicated in ROS section   nad ncat R TM partially obscured by wax, L TM w/o erthema  OP with MMM, White tongue noted.  No exudates.   Hoarse voice.  Sinuses ttp x4 Neck supple, no LA, No stridor rrr ctab abd soft Ext well perfused.

## 2017-03-26 NOTE — Telephone Encounter (Signed)
I will defer to [facility] on this, see below, I agree with her plans. Usually if the outside clinic thought the patient needed abx tx, then they'll order.

## 2017-03-26 NOTE — Telephone Encounter (Signed)
Noted. Will wait for her update.

## 2017-03-28 DIAGNOSIS — J0100 Acute maxillary sinusitis, unspecified: Secondary | ICD-10-CM | POA: Insufficient documentation

## 2017-03-28 NOTE — Assessment & Plan Note (Signed)
Still okay for outpatient f/u.  CTAB, no stridor.  Rest and fluids, lidocaine and nystatin by mouth for throat pain and thrush respectively.  Would start augmentin given the sinus tenderness and some sinusitis. Routine cautions given, update [place] as needed.  She agrees. Okay for outpatient follow-up.

## 2017-04-06 DIAGNOSIS — M545 Low back pain: Secondary | ICD-10-CM | POA: Diagnosis not present

## 2017-04-13 DIAGNOSIS — M545 Low back pain: Secondary | ICD-10-CM | POA: Diagnosis not present

## 2017-04-15 DIAGNOSIS — M545 Low back pain: Secondary | ICD-10-CM | POA: Diagnosis not present

## 2017-04-22 DIAGNOSIS — M545 Low back pain: Secondary | ICD-10-CM | POA: Diagnosis not present

## 2017-04-23 ENCOUNTER — Encounter: Payer: Self-pay | Admitting: Family Medicine

## 2017-04-23 ENCOUNTER — Ambulatory Visit (INDEPENDENT_AMBULATORY_CARE_PROVIDER_SITE_OTHER): Payer: 59 | Admitting: Family Medicine

## 2017-04-23 VITALS — BP 142/88 | HR 82 | Temp 98.8°F | Wt 180.5 lb

## 2017-04-23 DIAGNOSIS — R1310 Dysphagia, unspecified: Secondary | ICD-10-CM | POA: Diagnosis not present

## 2017-04-23 NOTE — Progress Notes (Signed)
Her prev sinusitis sx got better.  D/w pt.  Her ST got better in a few days.    She took some zyrtec but that irritated her ocular rosacea, with more eye dryness.    Now with dysphagia.  Some trouble prior to recent illness, worse with the recent illness.  She had feeling of rice sticking in the throat while she was sick.  That hasn't improved in the meantime.  "I have to swallow a whole bunch of times to get small bits of food down."  Doing well with fluids. No vomiting.  No fevers, chills.  She doesn't feel unwell.  Throat feels a little irritated.  Has taken zantac for ~25 years prev but off med for about 2 years.  No regurgitation.  The sx feel like it comes from the throat not lower down in the chest.  She feels slightly hoarse.  She usually doesn't get much heartburn during the day except with tea and tomato consumption but she cut that out.   Meds, vitals, and allergies reviewed.   ROS: Per HPI unless specifically indicated in ROS section   GEN: nad, alert and oriented HEENT: mucous membranes moist NECK: supple w/o LA, No stridor.   CV: rrr.  no murmur PULM: ctab, no inc wob ABD: soft, +bs EXT: no edema

## 2017-04-23 NOTE — Patient Instructions (Addendum)
The concern is for LPR.   Yolanda Delgado will call about your referral. Elevated the head of your bed.   Keep taking TUMS in the meantime.  Take care.  Glad to see you.

## 2017-04-26 DIAGNOSIS — R1310 Dysphagia, unspecified: Secondary | ICD-10-CM | POA: Insufficient documentation

## 2017-04-26 NOTE — Assessment & Plan Note (Signed)
The concern is for LPR.   Refer to GI.  Elevated the head of the bed.   Keep taking TUMS in the meantime.  Okay for outpatient follow-up. Discussed with patient. She agrees.

## 2017-04-28 ENCOUNTER — Ambulatory Visit: Payer: 59 | Admitting: Family Medicine

## 2017-04-28 DIAGNOSIS — H6980 Other specified disorders of Eustachian tube, unspecified ear: Secondary | ICD-10-CM | POA: Diagnosis not present

## 2017-04-28 DIAGNOSIS — J301 Allergic rhinitis due to pollen: Secondary | ICD-10-CM | POA: Diagnosis not present

## 2017-04-28 DIAGNOSIS — R070 Pain in throat: Secondary | ICD-10-CM | POA: Diagnosis not present

## 2017-04-28 DIAGNOSIS — R1310 Dysphagia, unspecified: Secondary | ICD-10-CM | POA: Diagnosis not present

## 2017-04-28 DIAGNOSIS — K219 Gastro-esophageal reflux disease without esophagitis: Secondary | ICD-10-CM | POA: Diagnosis not present

## 2017-04-29 DIAGNOSIS — M25551 Pain in right hip: Secondary | ICD-10-CM | POA: Diagnosis not present

## 2017-04-29 DIAGNOSIS — M7061 Trochanteric bursitis, right hip: Secondary | ICD-10-CM | POA: Diagnosis not present

## 2017-05-10 DIAGNOSIS — M25551 Pain in right hip: Secondary | ICD-10-CM | POA: Diagnosis not present

## 2017-05-10 DIAGNOSIS — M7061 Trochanteric bursitis, right hip: Secondary | ICD-10-CM | POA: Diagnosis not present

## 2017-05-11 ENCOUNTER — Other Ambulatory Visit (INDEPENDENT_AMBULATORY_CARE_PROVIDER_SITE_OTHER): Payer: 59

## 2017-05-11 DIAGNOSIS — E785 Hyperlipidemia, unspecified: Secondary | ICD-10-CM

## 2017-05-11 LAB — LIPID PANEL
CHOLESTEROL: 164 mg/dL (ref 0–200)
HDL: 66 mg/dL (ref >39.00)
LDL Cholesterol: 81 mg/dL (ref 0–99)
NonHDL: 98.2
Total CHOL/HDL Ratio: 2
Triglycerides: 88 mg/dL (ref 0.0–149.0)
VLDL: 17.6 mg/dL (ref 0.0–40.0)

## 2017-05-11 LAB — HEPATIC FUNCTION PANEL
ALBUMIN: 4.6 g/dL (ref 3.5–5.2)
ALK PHOS: 62 U/L (ref 39–117)
ALT: 37 U/L — AB (ref 0–35)
AST: 21 U/L (ref 0–37)
BILIRUBIN TOTAL: 1.2 mg/dL (ref 0.2–1.2)
Bilirubin, Direct: 0.2 mg/dL (ref 0.0–0.3)
Total Protein: 7.3 g/dL (ref 6.0–8.3)

## 2017-05-13 DIAGNOSIS — M25551 Pain in right hip: Secondary | ICD-10-CM | POA: Diagnosis not present

## 2017-05-13 DIAGNOSIS — M7061 Trochanteric bursitis, right hip: Secondary | ICD-10-CM | POA: Diagnosis not present

## 2017-05-17 DIAGNOSIS — M25551 Pain in right hip: Secondary | ICD-10-CM | POA: Diagnosis not present

## 2017-05-17 DIAGNOSIS — M7061 Trochanteric bursitis, right hip: Secondary | ICD-10-CM | POA: Diagnosis not present

## 2017-05-19 ENCOUNTER — Ambulatory Visit: Payer: 59 | Admitting: Family Medicine

## 2017-05-20 DIAGNOSIS — M25551 Pain in right hip: Secondary | ICD-10-CM | POA: Diagnosis not present

## 2017-05-20 DIAGNOSIS — M7061 Trochanteric bursitis, right hip: Secondary | ICD-10-CM | POA: Diagnosis not present

## 2017-05-24 DIAGNOSIS — M7061 Trochanteric bursitis, right hip: Secondary | ICD-10-CM | POA: Diagnosis not present

## 2017-05-24 DIAGNOSIS — M25551 Pain in right hip: Secondary | ICD-10-CM | POA: Diagnosis not present

## 2017-05-26 ENCOUNTER — Ambulatory Visit: Payer: 59 | Admitting: Family Medicine

## 2017-05-27 ENCOUNTER — Ambulatory Visit: Payer: 59 | Admitting: Gastroenterology

## 2017-05-31 ENCOUNTER — Ambulatory Visit (INDEPENDENT_AMBULATORY_CARE_PROVIDER_SITE_OTHER): Payer: 59 | Admitting: Family Medicine

## 2017-05-31 ENCOUNTER — Ambulatory Visit (INDEPENDENT_AMBULATORY_CARE_PROVIDER_SITE_OTHER)
Admission: RE | Admit: 2017-05-31 | Discharge: 2017-05-31 | Disposition: A | Discharge status: Home / Self Care | Payer: 59 | Source: Ambulatory Visit | Attending: Family Medicine | Admitting: Family Medicine

## 2017-05-31 ENCOUNTER — Encounter: Payer: Self-pay | Admitting: Family Medicine

## 2017-05-31 VITALS — BP 140/94 | HR 86 | Temp 98.7°F | Ht 66.75 in | Wt 180.2 lb

## 2017-05-31 DIAGNOSIS — M25551 Pain in right hip: Secondary | ICD-10-CM

## 2017-05-31 DIAGNOSIS — M1611 Unilateral primary osteoarthritis, right hip: Secondary | ICD-10-CM | POA: Diagnosis not present

## 2017-05-31 MED ORDER — PREDNISONE 20 MG PO TABS: 40.0000 mg | ORAL_TABLET | Freq: Every day | ORAL | 0 refills | Status: DC

## 2017-05-31 NOTE — Progress Notes (Signed)
Dr. Frederico Hamman T. Theophilus Walz, MD, [facility] [place], [address] Phone: [phone] Fax: [phone]  05/31/2017  Patient: Yolanda Delgado, MRN: [id number], DOB: 1956/07/30, 61 y.o.  Primary Physician:  Tonia Ghent, MD   Chief Complaint  Patient presents with  . Follow-up    Right Hip-Has gotten way worse   Subjective:   Yolanda Delgado is a 61 y.o. very pleasant female patient who presents with the following:  R hip. She feels like her pain in her hips gotten considerably worse, particularly with terminal rotational movements.  She has gotten stronger, but she has had some pain and pain with doing some of her rotational movement based physical therapy.  She also has an upcoming trip.  Past Medical History, Surgical History, Social History, Family History, Problem List, Medications, and Allergies have been reviewed and updated if relevant.  Patient Active Problem List   Diagnosis Date Noted  . Dysphagia 04/26/2017  . HLD (hyperlipidemia) 02/07/2017  . Migraine 02/05/2017  . HTN (hypertension) 02/05/2015  . Plantar fasciitis 02/05/2015  . Ocular rosacea 02/05/2015  . Advance care planning 02/05/2015    Past Medical History:  Diagnosis Date  . Colon polyp   . Family history of B12 deficiency   . Hypertension   . Hypokalemia    with diuretic use  . Migraine   . Ocular rosacea   . Palpitation    due to caffeine    Past Surgical History:  Procedure Laterality Date  . GALLBLADDER SURGERY    . TONSILLECTOMY AND ADENOIDECTOMY    . UTERINE ABLATION    . WISDOM TOOTH EXTRACTION      Social History   Social History  . Marital status: Married    Spouse name: N/A  . Number of children: N/A  . Years of education: N/A   Occupational History  . Not on file.   Social History Main Topics  . Smoking status: Never Smoker  . Smokeless tobacco: Never Used  . Alcohol use No  . Drug use: No  . Sexual activity:  Not on file   Other Topics Concern  . 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 Renal cancer Mother   . Breast cancer Mother   . Cancer Mother   . Stroke Father     Allergies  Allergen Reactions  . Ace Inhibitors Other (See Comments)    H/o leg cramping with ACE use.   Marland Kitchen Hydrochlorothiazide Other (See Comments)    Risk of hypokalemia- prev hospitalized.    . Lasix [Furosemide] Other (See Comments)    Risk of hypokalemia- prev hospitalized.  . Pamabrom Other (See Comments)    Low potassium  . Zyrtec [Cetirizine] Other (See Comments)    Worsening dry eyes  . Aspirin Other (See Comments)    Local swelling at the temples, but can tolerate ibuprofen and diclofenac    Medication list reviewed and updated in full in [place].  GEN: No fevers, chills. Nontoxic. Primarily MSK c/o today. MSK: Detailed in the HPI GI: tolerating PO intake without difficulty Neuro: No numbness, parasthesias, or tingling associated. Otherwise the pertinent positives of the ROS are noted above.   Objective:   BP Marland Kitchen)  140/94   Pulse 86   Temp 98.7 F (37.1 C) (Oral)   Ht 5' 6.75" (1.695 m)   Wt 180 lb 4 oz (81.8 kg)   BMI 28.44 kg/m    GEN: WDWN, NAD, Non-toxic, Alert & Oriented x 3 HEENT: Atraumatic, Normocephalic.  Ears and Nose: No external deformity. EXTR: No clubbing/cyanosis/edema NEURO: Normal gait.  PSYCH: Normally interactive. Conversant. Not depressed or anxious appearing.  Calm demeanor.   HIP EXAM: SIDE: R ROM: Abduction, Flexion, Internal and External range of motion: 20% decrease in overall motion compared to L Pain with terminal IROM and EROM: y, IROM FADIR + GTB: mild ttp SLR: NEG Knees: No effusion FABER: NT REVERSE FABER: NT, neg Piriformis: NT at direct palpation Str: flexion:  4+/5 abduction: 4+/5 adduction: 4+/5 Strength testing non-tender     Radiology: No results found.  Assessment and Plan:   Acute right hip pain - Plan: DG HIP UNILAT WITH PELVIS 2-3 VIEWS RIGHT  Right hip pain, groin region with worsening with internal rotation and a positive Fadir.  Intra-articular pathology such as arthritis versus labral tear would be of a concern.  For now, we're going to give her some oral prednisone, because she has an upcoming trip next week.  Check x-rays.  If no improvement with prednisone, then I would consider getting an MR arthrogram of the right hip.  Follow-up: No Follow-up on file.  Future Appointments Date Time Provider [place]  06/25/2017 10:30 AM Vanga, Tally Due, MD [facility] None    Meds ordered this encounter  Medications  . predniSONE (DELTASONE) 20 MG tablet    Sig: Take 2 tablets (40 mg total) by mouth daily with breakfast.    Dispense:  14 tablet    Refill:  0   There are no discontinued medications. Orders Placed This Encounter  Procedures  . DG HIP UNILAT WITH PELVIS 2-3 VIEWS RIGHT    Signed,  Ndia Sampath T. Rhilyn Battle, MD   Patient's Medications  New Prescriptions   PREDNISONE (DELTASONE) 20 MG TABLET    Take 2 tablets (40 mg total) by mouth daily with breakfast.  Previous Medications   AMLODIPINE (NORVASC) 5 MG TABLET    Take 1 tablet (5 mg total) by mouth daily.   ATORVASTATIN (LIPITOR) 10 MG TABLET    Take 1 tablet (10 mg total) by mouth daily.   DICLOFENAC (VOLTAREN) 75 MG EC TABLET    as needed.    GINGER, ZINGIBER OFFICINALIS, (GINGER ROOT) 250 MG CAPS    Takes 1 capsules daily   IRBESARTAN (AVAPRO) 150 MG TABLET    Take 1 tablet (150 mg total) by mouth daily.   NON FORMULARY    Sea Buckthorn Oil daily.   NUTRITIONAL SUPPLEMENTS (ESTROVEN PO)    Take by mouth daily.   SUMATRIPTAN (IMITREX) 50 MG TABLET    Take 1 tablet (50 mg total) by mouth daily as needed for migraine. May repeat in 2 hours if headache  persists or recurs.   TURMERIC PO    Take by mouth daily.  Modified Medications   No medications on file  Discontinued Medications   No medications on file

## 2017-06-03 ENCOUNTER — Ambulatory Visit: Payer: 59 | Admitting: Family Medicine

## 2017-06-16 ENCOUNTER — Telehealth: Payer: Self-pay | Admitting: Family Medicine

## 2017-06-16 DIAGNOSIS — M25551 Pain in right hip: Secondary | ICD-10-CM

## 2017-06-16 NOTE — Telephone Encounter (Signed)
Copied from [place] #[id number]. Topic: Referral - Request >> Jun 15, 2017  9:20 AM Ether Griffins B wrote: Reason for CRM: pt stating meds for her hip havent helped last time she spoke with dr. Lorelei Pont he stated if if pain continued she would need an MRI. Xrays where done at her last visit. She would like a referral and to schedule an MRI of her right hip.     Done Electronically Signed  By: Owens Loffler, MD On: 06/16/2017 2:19 PM

## 2017-06-24 ENCOUNTER — Other Ambulatory Visit: Payer: Self-pay

## 2017-06-25 ENCOUNTER — Encounter: Payer: Self-pay | Admitting: Gastroenterology

## 2017-06-25 ENCOUNTER — Ambulatory Visit (INDEPENDENT_AMBULATORY_CARE_PROVIDER_SITE_OTHER): Payer: 59 | Admitting: Gastroenterology

## 2017-06-25 VITALS — BP 143/92 | HR 76 | Ht 66.0 in | Wt 180.2 lb

## 2017-06-25 DIAGNOSIS — R945 Abnormal results of liver function studies: Secondary | ICD-10-CM

## 2017-06-25 DIAGNOSIS — R1310 Dysphagia, unspecified: Secondary | ICD-10-CM | POA: Diagnosis not present

## 2017-06-25 DIAGNOSIS — R7989 Other specified abnormal findings of blood chemistry: Secondary | ICD-10-CM

## 2017-06-25 DIAGNOSIS — R1319 Other dysphagia: Secondary | ICD-10-CM

## 2017-06-25 NOTE — Progress Notes (Signed)
Cephas Darby, MD [address]  [place]  [place], [place] [address]  Main: [phone]  Fax: [phone]    Gastroenterology Consultation  Referring Provider:     Tonia Ghent, MD Primary Care Physician:  Tonia Ghent, MD Primary Gastroenterologist:  Dr. Cephas Darby Reason for Consultation: Dysphagia to solids        HPI:   Yolanda Delgado is a 61 y.o. female referred by Dr. Tonia Ghent, MD  for consultation & management of dysphagia since 02/2017. She had throat infection in end of July, 2018, took 3-4 weeks to recover. She also developed difficulty swallowing to solids only particularly hard foods during this episode, continued after throat infection resolved. She was seen by ENT, was told "her throat was swollen", started her on ranitidine150mg  daily. Dysphagia has improved significantly since then. She has h/o heart burn relieved by H2 blocker in the past. She was not any acid suppression medication prior to throat infection.   She also had mildly elevated LFTs which are improving. She denies drinking alcohol. She did take milk thistle and she said her LFTs improved.  NSAIDs: None  Antiplts/Anticoagulants/Anti thrombotics: None  GI Procedures:  Colonoscopy in 2011, large polyp was removed, then colonoscopy in 2012, was normal Colonoscopy 03/28/2014, was normal, recommended f/u in 5years EGD in 2011 in [place] at [facility], was normal for f/u of GERD  Past Medical History:  Diagnosis Date  . Colon polyp   . Family history of B12 deficiency   . Hypertension   . Hypokalemia    with diuretic use  . Migraine   . Ocular rosacea   . Palpitation    due to caffeine    Past Surgical History:  Procedure Laterality Date  . GALLBLADDER SURGERY    . TONSILLECTOMY AND ADENOIDECTOMY    . UTERINE ABLATION    . WISDOM TOOTH EXTRACTION      Prior to Admission medications   Medication Sig Start Date End Date Taking? Authorizing Provider  amLODipine  (NORVASC) 5 MG tablet Take 1 tablet (5 mg total) by mouth daily. 03/26/17   Tonia Ghent, MD  atorvastatin (LIPITOR) 10 MG tablet Take 1 tablet (10 mg total) by mouth daily. 02/03/17   Tonia Ghent, MD  diclofenac (VOLTAREN) 75 MG EC tablet as needed.  04/16/14   [provider]  Ginger, Zingiber officinalis, (GINGER ROOT) 250 MG CAPS Takes 1 capsules daily    [provider]  irbesartan (AVAPRO) 150 MG tablet Take 1 tablet (150 mg total) by mouth daily. 02/03/17   Tonia Ghent, MD  lidocaine (XYLOCAINE) 2 % solution SWISH& SWALLOW 5 MLS IN THE MOUTH OR THROAT EVERY 6 HOURS AS NEEDED FOR MOUTH PAIN 03/26/17   [provider]  NON FORMULARY Sea Buckthorn Oil daily.    [provider]  Nutritional Supplements (ESTROVEN PO) Take by mouth daily.    [provider]  predniSONE (DELTASONE) 20 MG tablet Take 2 tablets (40 mg total) by mouth daily with breakfast. 05/31/17   Copland, Frederico Hamman, MD  SUMAtriptan (IMITREX) 50 MG tablet Take 1 tablet (50 mg total) by mouth daily as needed for migraine. May repeat in 2 hours if headache persists or recurs. 02/03/17   Tonia Ghent, MD  TURMERIC PO Take by mouth daily.    [provider]    Family History  Problem Relation Age of Onset  . Renal cancer Mother   . Breast cancer Mother   .  Cancer Mother   . Stroke Father      Social History  Substance Use Topics  . Smoking status: Never Smoker  . Smokeless tobacco: Never Used  . Alcohol use No    Allergies as of 06/25/2017 - Review Complete 06/25/2017  Allergen Reaction Noted  . Ace inhibitors Other (See Comments) 02/03/2017  . Hydrochlorothiazide Other (See Comments) 02/04/2015  . Lasix [furosemide] Other (See Comments) 02/04/2015  . Pamabrom Other (See Comments) 01/07/2016  . Zyrtec [cetirizine] Other (See Comments) 04/23/2017  . Aspirin Other (See Comments) 06/19/2013    Review of Systems:    All systems reviewed and negative except  where noted in HPI.   Physical Exam:  BP (!) 143/92   Pulse 76   Ht 5\' 6"  (1.676 m)   Wt 180 lb 3.2 oz (81.7 kg)   BMI 29.09 kg/m  No LMP recorded. Patient is postmenopausal.  General:   Alert,  Well-developed, well-nourished, pleasant and cooperative in NAD Head:  Normocephalic and atraumatic. Eyes:  Sclera clear, no icterus.   Conjunctiva pink. Ears:  Normal auditory acuity. Nose:  No deformity, discharge, or lesions. Mouth:  No deformity or lesions,oropharynx pink & moist. Neck:  Supple; no masses or thyromegaly. Lungs:  Respirations even and unlabored.  Clear throughout to auscultation.   No wheezes, crackles, or rhonchi. No acute distress. Heart:  Regular rate and rhythm; no murmurs, clicks, rubs, or gallops. Abdomen:  Normal bowel sounds. Soft, non-tender and non-distended without masses, hepatosplenomegaly or hernias noted.  No guarding or rebound tenderness.   Rectal: Nor performed Msk:  Symmetrical without gross deformities. Good, equal movement & strength bilaterally. Pulses:  Normal pulses noted. Extremities:  No clubbing or edema.  No cyanosis. Neurologic:  Alert and oriented x3;  grossly normal neurologically. Skin:  Intact without significant lesions or rashes. No jaundice. Lymph Nodes:  No significant cervical adenopathy. Psych:  Alert and cooperative. Normal mood and affect.  Imaging Studies: None  Assessment and Plan:   Yolanda Delgado is a 61 y.o. female with chronic GERD without erosive esophagitis, with 3 month history of dysphagia to solids triggered after throat infection. Most likely oropharyngeal dysphagia based on her history and to some extent precipitated with acid reflux in the setting of recent illness. Her dysphagia has significantly responded to H2 blockers. She will continue ranitidine daily for now. I will not pursue EGD at this time unless her dysphagia persists or worsens in next 2 months  Mildly elevated transaminases: Recommend viral hepatitis  panel and right upper quadrant ultrasound   Follow up in 2 months   Cephas Darby, MD

## 2017-06-29 ENCOUNTER — Other Ambulatory Visit: Payer: Self-pay | Admitting: Family Medicine

## 2017-06-29 DIAGNOSIS — R7989 Other specified abnormal findings of blood chemistry: Secondary | ICD-10-CM

## 2017-06-29 DIAGNOSIS — R945 Abnormal results of liver function studies: Principal | ICD-10-CM

## 2017-06-29 NOTE — Progress Notes (Signed)
Notify patient.  I talked to the GI clinic.  I will await the ultrasound that they have ordered.  I put in the order for acute hepatitis panel, to be done at nonfasting lab visit.  Thanks.  Tried to phone patient, no answer and recording states voicemail has not been set up yet.  Mike Craze, CMA  06/29/17  Patient advised.  Lab appt scheduled. Mike Craze, CMA  07/01/17

## 2017-07-05 ENCOUNTER — Ambulatory Visit
Admission: RE | Admit: 2017-07-05 | Discharge: 2017-07-05 | Disposition: A | Discharge status: Home / Self Care | Payer: 59 | Source: Ambulatory Visit | Attending: Family Medicine | Admitting: Family Medicine

## 2017-07-05 DIAGNOSIS — M25551 Pain in right hip: Secondary | ICD-10-CM

## 2017-07-05 DIAGNOSIS — M1611 Unilateral primary osteoarthritis, right hip: Secondary | ICD-10-CM | POA: Diagnosis not present

## 2017-07-05 MED ORDER — IOPAMIDOL (ISOVUE-M 200) INJECTION 41%
15.0000 mL | Freq: Once | INTRAMUSCULAR | Status: AC
  Administered 2017-07-05: 15 mL via INTRA_ARTICULAR

## 2017-07-07 ENCOUNTER — Other Ambulatory Visit (INDEPENDENT_AMBULATORY_CARE_PROVIDER_SITE_OTHER): Payer: 59

## 2017-07-07 ENCOUNTER — Other Ambulatory Visit: Payer: 59

## 2017-07-07 ENCOUNTER — Ambulatory Visit
Admission: RE | Admit: 2017-07-07 | Discharge: 2017-07-07 | Disposition: A | Discharge status: Home / Self Care | Payer: 59 | Source: Ambulatory Visit | Attending: Gastroenterology | Admitting: Gastroenterology

## 2017-07-07 DIAGNOSIS — R7989 Other specified abnormal findings of blood chemistry: Secondary | ICD-10-CM

## 2017-07-07 DIAGNOSIS — R944 Abnormal results of kidney function studies: Secondary | ICD-10-CM | POA: Diagnosis not present

## 2017-07-07 DIAGNOSIS — R945 Abnormal results of liver function studies: Secondary | ICD-10-CM

## 2017-07-07 DIAGNOSIS — Z9049 Acquired absence of other specified parts of digestive tract: Secondary | ICD-10-CM | POA: Insufficient documentation

## 2017-07-08 ENCOUNTER — Other Ambulatory Visit: Payer: Self-pay | Admitting: *Deleted

## 2017-07-08 LAB — HEPATITIS PANEL, ACUTE
HEP B S AG: NONREACTIVE
Hep A IgM: NONREACTIVE
Hep B C IgM: NONREACTIVE
Hepatitis C Ab: NONREACTIVE
SIGNAL TO CUT-OFF: 0.02 (ref <1.00)

## 2017-07-08 MED ORDER — DICLOFENAC SODIUM 75 MG PO TBEC: 75.0000 mg | DELAYED_RELEASE_TABLET | Freq: Two times a day (BID) | ORAL | 2 refills | Status: DC

## 2017-07-09 ENCOUNTER — Other Ambulatory Visit: Payer: Self-pay | Admitting: Family Medicine

## 2017-07-09 MED ORDER — DICLOFENAC SODIUM 75 MG PO TBEC: 75.0000 mg | DELAYED_RELEASE_TABLET | Freq: Two times a day (BID) | ORAL | 2 refills | Status: DC

## 2017-07-11 ENCOUNTER — Encounter: Payer: Self-pay | Admitting: Family Medicine

## 2017-07-11 DIAGNOSIS — K760 Fatty (change of) liver, not elsewhere classified: Secondary | ICD-10-CM | POA: Insufficient documentation

## 2017-07-13 ENCOUNTER — Telehealth: Payer: Self-pay

## 2017-07-13 NOTE — Telephone Encounter (Signed)
Patient has been notified that her ultrasound liver shows that she has fatty liver. Otherwise unremarkable study. This explains her mildly elevated liver enzymes. Her viral hepatitis panel came back negative. She should watch her diet carefully, follow low-fat low-carb diet which will help with improvement of her fatty liver disease.   Thanks [employer]

## 2017-09-10 ENCOUNTER — Ambulatory Visit: Payer: 59 | Admitting: Gastroenterology

## 2017-09-17 ENCOUNTER — Encounter: Payer: Self-pay | Admitting: Family Medicine

## 2017-09-17 ENCOUNTER — Ambulatory Visit: Payer: Self-pay | Admitting: *Deleted

## 2017-09-17 ENCOUNTER — Ambulatory Visit: Payer: 59 | Admitting: Family Medicine

## 2017-09-17 VITALS — BP 128/72 | HR 69 | Temp 98.6°F | Wt 184.5 lb

## 2017-09-17 DIAGNOSIS — R252 Cramp and spasm: Secondary | ICD-10-CM

## 2017-09-17 DIAGNOSIS — R55 Syncope and collapse: Secondary | ICD-10-CM

## 2017-09-17 DIAGNOSIS — R0789 Other chest pain: Secondary | ICD-10-CM

## 2017-09-17 NOTE — Telephone Encounter (Signed)
Pt called to inquire about what she needed to look for with a heart attack. Called patient back and she states she had a sharp cramp around her heart area 2 days ago. She stated she felt dizzy and felt herself passing out. She was in a resturant and had to lie down on the bench. She stated that it did hurt to take a deep breath. She does have an irregular heart beat at times. This episode lasted for about a couple of mins. Pain # was a 7 at that time. She has not had any more episodes of that since that day. Appointment made for patient to be assessed.   Reason for Disposition . [1] Chest pain lasts > 5 minutes AND [2] occurred > 3 days ago (72 hours) AND [3] NO chest pain or cardiac symptoms now  Answer Assessment - Initial Assessment Questions 1. LOCATION: "Where does it hurt?"       Left side 2. RADIATION: "Does the pain go anywhere else?" (e.g., into neck, jaw, arms, back)     Did not 3. ONSET: "When did the chest pain begin?" (Minutes, hours or days)      2 days ago 4. PATTERN "Does the pain come and go, or has it been constant since it started?"  "Does it get worse with exertion?"      no 5. DURATION: "How long does it last" (e.g., seconds, minutes, hours)     Lasted a couple of mins 6. SEVERITY: "How bad is the pain?"  (e.g., Scale 1-10; mild, moderate, or severe)    - MILD (1-3): doesn't interfere with normal activities     - MODERATE (4-7): interferes with normal activities or awakens from sleep    - SEVERE (8-10): excruciating pain, unable to do any normal activities       #7 but brief 7. CARDIAC RISK FACTORS: "Do you have any history of heart problems or risk factors for heart disease?" (e.g., prior heart attack, angina; high blood pressure, diabetes, being overweight, high cholesterol, smoking, or strong family history of heart disease)     Irregular heart rhythm, high blood pressure, sl elevated cholesterol, grandfather died with heart attack, mom with afib 8. PULMONARY RISK  FACTORS: "Do you have any history of lung disease?"  (e.g., blood clots in lung, asthma, emphysema, birth control pills)     no 9. CAUSE: "What do you think is causing the chest pain?"     Not sure 10. OTHER SYMPTOMS: "Do you have any other symptoms?" (e.g., dizziness, nausea, vomiting, sweating, fever, difficulty breathing, cough)       Dizziness, starting to blackout, hurt to breathe 11. PREGNANCY: "Is there any chance you are pregnant?" "When was your last menstrual period?"       n/a  Protocols used: CHEST PAIN-A-AH

## 2017-09-17 NOTE — Telephone Encounter (Signed)
Pt has 15' appt 09/17/17 at 2:15 with Glenda Chroman FNP.

## 2017-09-17 NOTE — Patient Instructions (Signed)
It was a pleasure to meet you today  Please stop at the lab and then see Rosaria Ferries

## 2017-09-17 NOTE — Progress Notes (Signed)
   Subjective:    Patient ID: Yolanda Delgado, female    DOB: 1956/02/11, 62 y.o.   MRN: [id number]  HPI This is a 62 yo female who presents today with an episode of sharp left sided chest and arm pain 2 days ago when she raised her arm above her head. Felt like a charley horse and then felt like she was going to faint. After a few minutes, lightheadedness resolved. Felt ok to drive with in about 15 minutes. Since episode she has felt some burning in her upper left chest. Intermittent. Lasts for a few seconds. No nausea. No SOB.  Has been having more muscle cramps lately- legs.   Past Medical History:  Diagnosis Date  . Colon polyp   . Family history of B12 deficiency   . Hypertension   . Hypokalemia    with diuretic use  . Migraine   . Ocular rosacea   . Palpitation    due to caffeine   Past Surgical History:  Procedure Laterality Date  . GALLBLADDER SURGERY    . TONSILLECTOMY AND ADENOIDECTOMY    . UTERINE ABLATION    . WISDOM TOOTH EXTRACTION     Family History  Problem Relation Age of Onset  . Renal cancer Mother   . Breast cancer Mother   . Cancer Mother   . Stroke Father    Social History   Tobacco Use  . Smoking status: Never Smoker  . Smokeless tobacco: Never Used  Substance Use Topics  . Alcohol use: No  . Drug use: No      Review of Systems Per HPI    Objective:   Physical Exam  Constitutional: She is oriented to person, place, and time. She appears well-developed and well-nourished. No distress.  HENT:  Head: Normocephalic and atraumatic.  Eyes: Conjunctivae are normal.  Neck: Normal range of motion. Neck supple.  Cardiovascular: Normal rate, regular rhythm and normal heart sounds.  Pulmonary/Chest: Effort normal and breath sounds normal. She exhibits tenderness (left upper chest).  Neurological: She is alert and oriented to person, place, and time.  Skin: Skin is warm and dry. She is not diaphoretic.  Psychiatric: She has a normal mood and affect.  Her behavior is normal. Judgment and thought content normal.  Vitals reviewed.     BP 128/72   Pulse 69   Temp 98.6 F (37 C) (Oral)   Wt 184 lb 8 oz (83.7 kg)   SpO2 98%   BMI 29.78 kg/m  Wt Readings from Last 3 Encounters:  09/17/17 184 lb 8 oz (83.7 kg)  06/25/17 180 lb 3.2 oz (81.7 kg)  05/31/17 180 lb 4 oz (81.8 kg)   EKG- normal sinus rhythm, rate 67    Assessment & Plan:  Discussed with Dr. Damita Dunnings who agreed with plan 1. Other chest pain - unclear etiology but with associated near syncope will refer to cardiology - EKG 12-Lead - CBC with Differential - TSH - Comprehensive metabolic panel - Ambulatory referral to Cardiology  2. Pre-syncope - EKG 12-Lead - CBC with Differential - TSH - Comprehensive metabolic panel - Ambulatory referral to Cardiology  3. Muscle cramps - Magnesium   Clarene Reamer, FNP-BC   Primary Care at [facility], [place]  09/17/2017 2:53 PM

## 2017-09-18 LAB — CBC WITH DIFFERENTIAL/PLATELET
Basophils Absolute: 60 cells/uL (ref 0–200)
Basophils Relative: 0.8 %
EOS PCT: 2.4 %
Eosinophils Absolute: 180 cells/uL (ref 15–500)
HCT: 37 % (ref 35.0–45.0)
Hemoglobin: 12.9 g/dL (ref 11.7–15.5)
Lymphs Abs: 3300 cells/uL (ref 850–3900)
MCH: 30.1 pg (ref 27.0–33.0)
MCHC: 34.9 g/dL (ref 32.0–36.0)
MCV: 86.4 fL (ref 80.0–100.0)
MPV: 10.2 fL (ref 7.5–12.5)
Monocytes Relative: 6.5 %
NEUTROS PCT: 46.3 %
Neutro Abs: 3473 cells/uL (ref 1500–7800)
PLATELETS: 303 10*3/uL (ref 140–400)
RBC: 4.28 10*6/uL (ref 3.80–5.10)
RDW: 12.5 % (ref 11.0–15.0)
TOTAL LYMPHOCYTE: 44 %
WBC mixed population: 488 cells/uL (ref 200–950)
WBC: 7.5 10*3/uL (ref 3.8–10.8)

## 2017-09-18 LAB — COMPREHENSIVE METABOLIC PANEL
AG Ratio: 1.9 (calc) (ref 1.0–2.5)
ALBUMIN MSPROF: 4.7 g/dL (ref 3.6–5.1)
ALT: 25 U/L (ref 6–29)
AST: 17 U/L (ref 10–35)
Alkaline phosphatase (APISO): 83 U/L (ref 33–130)
BUN: 18 mg/dL (ref 7–25)
CO2: 25 mmol/L (ref 20–32)
CREATININE: 0.72 mg/dL (ref 0.50–1.05)
Calcium: 9.9 mg/dL (ref 8.6–10.4)
Chloride: 102 mmol/L (ref 98–110)
Globulin: 2.5 g/dL (calc) (ref 1.9–3.7)
Glucose, Bld: 91 mg/dL (ref 65–99)
POTASSIUM: 3.9 mmol/L (ref 3.5–5.3)
Sodium: 139 mmol/L (ref 135–146)
TOTAL PROTEIN: 7.2 g/dL (ref 6.1–8.1)
Total Bilirubin: 1.1 mg/dL (ref 0.2–1.2)

## 2017-09-18 LAB — TSH: TSH: 3.03 m[IU]/L (ref 0.40–4.50)

## 2017-09-18 LAB — MAGNESIUM: MAGNESIUM: 2.2 mg/dL (ref 1.5–2.5)

## 2017-09-20 ENCOUNTER — Ambulatory Visit: Payer: 59 | Admitting: Family Medicine

## 2017-10-21 ENCOUNTER — Ambulatory Visit: Payer: 59 | Admitting: Cardiovascular Disease

## 2018-01-05 ENCOUNTER — Other Ambulatory Visit: Payer: Self-pay | Admitting: Family Medicine

## 2018-01-05 NOTE — Telephone Encounter (Signed)
Electronic refill request  Last office visit 09/17/17/acute Last refill 02/03/17 #10/3

## 2018-01-06 ENCOUNTER — Encounter: Payer: Self-pay | Admitting: *Deleted

## 2018-01-06 NOTE — Telephone Encounter (Signed)
Sent.  Due for CPE this fall.  Thanks.  

## 2018-01-06 NOTE — Telephone Encounter (Signed)
Letter mailed

## 2018-01-31 ENCOUNTER — Ambulatory Visit: Payer: 59 | Admitting: Cardiovascular Disease

## 2018-02-10 ENCOUNTER — Other Ambulatory Visit: Payer: Self-pay | Admitting: *Deleted

## 2018-02-10 ENCOUNTER — Telehealth: Payer: Self-pay

## 2018-02-10 MED ORDER — AMLODIPINE BESYLATE 5 MG PO TABS: 5.0000 mg | ORAL_TABLET | Freq: Every day | ORAL | 0 refills | Status: DC

## 2018-02-10 NOTE — Telephone Encounter (Signed)
Patient is needing a refill on Amlodipine, she is running out. She did make an appointment for routine follow up on 03/04/18. The reason she needs to wait till then is because her daughter is due to have a baby anytime between now and 03/02/18 and did not want to risk scheduling and having to cancel. Please review. Pharmacy is [facility]. Please let patient know CB: [phone]

## 2018-02-11 NOTE — Telephone Encounter (Signed)
Patient was advised that medication was sent in to the [facility], R[facility]

## 2018-02-28 ENCOUNTER — Telehealth: Payer: Self-pay | Admitting: *Deleted

## 2018-02-28 NOTE — Telephone Encounter (Signed)
Fax received from pharmacy stating that patient requests new Rx.  Patient is paying $36 for Irbesartan 150 mg.  Lower cost alternative Losartan 50 mg $20 if clinically appropriate.

## 2018-03-01 MED ORDER — LOSARTAN POTASSIUM 50 MG PO TABS: 50.0000 mg | ORAL_TABLET | Freq: Every day | ORAL | 3 refills | Status: DC

## 2018-03-01 NOTE — Telephone Encounter (Signed)
Spoke with patient who had not initiated the call for a med change and will work this out with her pharmacy.

## 2018-03-01 NOTE — Telephone Encounter (Signed)
Reasonable.  Have her check BP after the change and update [place] if needed, if BP >140/>90 or if lightheaded.  She has f/u pending.

## 2018-03-01 NOTE — Addendum Note (Signed)
Addended by: Tonia Ghent on: 03/01/2018 06:12 AM   Modules accepted: Orders

## 2018-03-01 NOTE — Telephone Encounter (Signed)
The only phone number listed appears to have been disconnected.

## 2018-03-02 ENCOUNTER — Telehealth: Payer: Self-pay | Admitting: Family Medicine

## 2018-03-02 NOTE — Telephone Encounter (Signed)
-----   Message from Candise Bowens sent at 03/02/2018  2:08 PM EDT ----- Juluis Rainier- Pt cancelled appt for med refill by phone reminder system. I called to reschedule but no answer.   Thanks, [employer]

## 2018-03-02 NOTE — Telephone Encounter (Signed)
Noted. Thanks.

## 2018-03-04 ENCOUNTER — Ambulatory Visit: Payer: 59 | Admitting: Family Medicine

## 2018-03-09 ENCOUNTER — Other Ambulatory Visit: Payer: Self-pay | Admitting: Family Medicine

## 2018-03-11 ENCOUNTER — Ambulatory Visit: Payer: 59 | Admitting: Family Medicine

## 2018-03-12 ENCOUNTER — Other Ambulatory Visit: Payer: Self-pay | Admitting: Family Medicine

## 2018-03-14 NOTE — Telephone Encounter (Signed)
Electronic refill request Last office visit 09/17/17/acute Last refill 07/09/17 #60/2 See allergy/contraindication

## 2018-03-18 ENCOUNTER — Encounter: Payer: Self-pay | Admitting: Family Medicine

## 2018-03-18 ENCOUNTER — Ambulatory Visit: Payer: 59 | Admitting: Family Medicine

## 2018-03-18 VITALS — BP 130/92 | HR 69 | Temp 98.5°F | Ht 66.0 in | Wt 187.0 lb

## 2018-03-18 DIAGNOSIS — Z0000 Encounter for general adult medical examination without abnormal findings: Secondary | ICD-10-CM

## 2018-03-18 DIAGNOSIS — I10 Essential (primary) hypertension: Secondary | ICD-10-CM | POA: Diagnosis not present

## 2018-03-18 DIAGNOSIS — G43909 Migraine, unspecified, not intractable, without status migrainosus: Secondary | ICD-10-CM

## 2018-03-18 DIAGNOSIS — E785 Hyperlipidemia, unspecified: Secondary | ICD-10-CM | POA: Diagnosis not present

## 2018-03-18 MED ORDER — AMLODIPINE BESYLATE 5 MG PO TABS: 5.0000 mg | ORAL_TABLET | Freq: Every day | ORAL | 3 refills | Status: DC

## 2018-03-18 NOTE — Progress Notes (Signed)
Hypertension:    Using medication without problems or lightheadedness: yes Chest pain with exertion:no Edema:no Short of breath:no Average home BPs: generally controlled.  She wasn't on statin consistently.  D/w pt about med use and possible aches.  She is only taking med a few times a month.  No ADE on med.    Taking diclofenac as needed, not daily.    Migraines (L sided HA) improved with sumitriptan.  No ADE on med.  Used not frequently but she keeps it on hand.    She has occ slowing of recall with words. She reports outside imaging from 2008 of her brain, with atrophy noted per patient report.  I don't see that report currently or in old records.  She has noted sx in the last 1.5 years.  She normal noncontrast CT scan of the brain in 2013.   Mammogram encouraged.   Pap d/w pt. Per GYN, d/w pt.  Done ~2016 per patient report shingrix d/w pt.  Out of stock.    Meds, vitals, and allergies reviewed.   PMH and SH reviewed  ROS: Per HPI unless specifically indicated in ROS section   GEN: nad, alert and oriented HEENT: mucous membranes moist NECK: supple w/o LA CV: rrr. PULM: ctab, no inc wob ABD: soft, +bs EXT: no edema SKIN: no acute rash

## 2018-03-18 NOTE — Patient Instructions (Addendum)
You can call for a mammogram at [facility] at [facility].  [place]  Let me check your old records.  In the meantime, please check with with the old clinic to see what records you can get.   We need to recheck your memory at a 30 minute visit with labs after I have seen the old records.     Take care.  Glad to see you.

## 2018-03-21 ENCOUNTER — Telehealth: Payer: Self-pay | Admitting: Family Medicine

## 2018-03-21 DIAGNOSIS — Z0000 Encounter for general adult medical examination without abnormal findings: Secondary | ICD-10-CM | POA: Insufficient documentation

## 2018-03-21 NOTE — Assessment & Plan Note (Signed)
Mammogram encouraged.   Pap d/w pt. Per GYN, d/w pt.  Done ~2016 per patient report shingrix d/w pt.  Out of stock.

## 2018-03-21 NOTE — Assessment & Plan Note (Signed)
She declined daily statin use due to concern about potential side effects.  She is taking it intermittently without adverse effect.  She was worried about dementia risk with medication.  She reports that she had abnormal brain imaging about 10 years ago.  I do not see those records.  I asked her to check with her prev MD to get a copy of the MRI report.  He has a normal non-contrast CT of her head from 2013.  >25 minutes spent in face to face time with patient, >50% spent in counselling or coordination of care.   See follow up phone note.

## 2018-03-21 NOTE — Assessment & Plan Note (Signed)
Migraines (L sided HA) improved with sumitriptan.  No ADE on med.  Used not frequently but she keeps it on hand.

## 2018-03-21 NOTE — Telephone Encounter (Signed)
No answer, VM not set up yet.

## 2018-03-21 NOTE — Telephone Encounter (Signed)
Notify patient.  When I reviewed her old records, I do not see the MRI from about 10 years ago that she was referencing.  I need her to get a 30-minute appointment to recheck her memory and go over those results, assuming she can get a copy of the report and bring it to the visit.  We can do labs at that visit if needed.  I'll defer in the meantime.  Thanks.

## 2018-03-21 NOTE — Assessment & Plan Note (Signed)
No change in meds at this point.  Needs to continue work on diet and exercise.

## 2018-03-22 NOTE — Telephone Encounter (Signed)
Patient advised. Patient says she has tried to secure the records but the place where she had it done, the hospital, and the MD office that ordered it says records have been purged from >10 years ago.  She says it may take 10 days to 2 weeks but she will make the 30 minute appointment when she gets it.

## 2018-03-22 NOTE — Telephone Encounter (Signed)
No answer, no VM set up

## 2018-06-06 DIAGNOSIS — L738 Other specified follicular disorders: Secondary | ICD-10-CM | POA: Diagnosis not present

## 2018-06-06 DIAGNOSIS — D235 Other benign neoplasm of skin of trunk: Secondary | ICD-10-CM | POA: Diagnosis not present

## 2018-06-06 DIAGNOSIS — L718 Other rosacea: Secondary | ICD-10-CM | POA: Diagnosis not present

## 2018-06-25 ENCOUNTER — Other Ambulatory Visit: Payer: Self-pay | Admitting: Family Medicine

## 2018-06-25 DIAGNOSIS — E785 Hyperlipidemia, unspecified: Secondary | ICD-10-CM

## 2018-06-27 NOTE — Telephone Encounter (Signed)
Electronic refill request. Sumatriptan Last office visit:   03/18/18 Last Filled:    10 tablet 3 01/06/2018  Please advise.

## 2018-06-28 NOTE — Telephone Encounter (Signed)
Sent.  She needs 30 min OV re: fu of lipids, fatty liver disease and memory testing.  Should get labs ahead of time.  Lab orders are in.

## 2018-06-28 NOTE — Telephone Encounter (Signed)
Spoke with patient. Due to work schedule, and traveling and been out of town patient asked to come in January for appointments. Appointments have been [employer], [facility]

## 2018-07-08 DIAGNOSIS — Z23 Encounter for immunization: Secondary | ICD-10-CM | POA: Diagnosis not present

## 2018-07-20 DIAGNOSIS — M25551 Pain in right hip: Secondary | ICD-10-CM | POA: Diagnosis not present

## 2018-07-20 DIAGNOSIS — M76891 Other specified enthesopathies of right lower limb, excluding foot: Secondary | ICD-10-CM | POA: Diagnosis not present

## 2018-07-20 DIAGNOSIS — M1611 Unilateral primary osteoarthritis, right hip: Secondary | ICD-10-CM | POA: Diagnosis not present

## 2018-08-09 ENCOUNTER — Other Ambulatory Visit: Payer: 59

## 2018-08-31 ENCOUNTER — Other Ambulatory Visit (INDEPENDENT_AMBULATORY_CARE_PROVIDER_SITE_OTHER): Payer: 59

## 2018-08-31 DIAGNOSIS — I10 Essential (primary) hypertension: Secondary | ICD-10-CM | POA: Diagnosis not present

## 2018-08-31 DIAGNOSIS — E785 Hyperlipidemia, unspecified: Secondary | ICD-10-CM | POA: Diagnosis not present

## 2018-08-31 LAB — LIPID PANEL
CHOLESTEROL: 275 mg/dL — AB (ref 0–200)
HDL: 55.5 mg/dL (ref >39.00)
LDL Cholesterol: 188 mg/dL — ABNORMAL HIGH (ref 0–99)
NonHDL: 219.18
Total CHOL/HDL Ratio: 5
Triglycerides: 154 mg/dL — ABNORMAL HIGH (ref 0.0–149.0)
VLDL: 30.8 mg/dL (ref 0.0–40.0)

## 2018-08-31 LAB — COMPREHENSIVE METABOLIC PANEL
ALBUMIN: 4.6 g/dL (ref 3.5–5.2)
ALT: 49 U/L — ABNORMAL HIGH (ref 0–35)
AST: 23 U/L (ref 0–37)
Alkaline Phosphatase: 76 U/L (ref 39–117)
BILIRUBIN TOTAL: 1.6 mg/dL — AB (ref 0.2–1.2)
BUN: 25 mg/dL — ABNORMAL HIGH (ref 6–23)
CO2: 28 mEq/L (ref 19–32)
Calcium: 10 mg/dL (ref 8.4–10.5)
Chloride: 104 mEq/L (ref 96–112)
Creatinine, Ser: 0.71 mg/dL (ref 0.40–1.20)
GFR: 89.13 mL/min (ref >60.00)
Glucose, Bld: 92 mg/dL (ref 70–99)
POTASSIUM: 3.8 meq/L (ref 3.5–5.1)
Sodium: 139 mEq/L (ref 135–145)
Total Protein: 7.2 g/dL (ref 6.0–8.3)

## 2018-09-05 ENCOUNTER — Ambulatory Visit: Payer: 59 | Admitting: Family Medicine

## 2018-09-05 ENCOUNTER — Encounter: Payer: Self-pay | Admitting: Family Medicine

## 2018-09-05 VITALS — BP 142/98 | HR 75 | Temp 99.2°F | Ht 66.0 in | Wt 186.2 lb

## 2018-09-05 DIAGNOSIS — Z7189 Other specified counseling: Secondary | ICD-10-CM

## 2018-09-05 DIAGNOSIS — G43909 Migraine, unspecified, not intractable, without status migrainosus: Secondary | ICD-10-CM

## 2018-09-05 DIAGNOSIS — K760 Fatty (change of) liver, not elsewhere classified: Secondary | ICD-10-CM

## 2018-09-05 DIAGNOSIS — E785 Hyperlipidemia, unspecified: Secondary | ICD-10-CM

## 2018-09-05 DIAGNOSIS — I10 Essential (primary) hypertension: Secondary | ICD-10-CM

## 2018-09-05 DIAGNOSIS — Z789 Other specified health status: Secondary | ICD-10-CM

## 2018-09-05 DIAGNOSIS — Z0000 Encounter for general adult medical examination without abnormal findings: Secondary | ICD-10-CM | POA: Diagnosis not present

## 2018-09-05 NOTE — Progress Notes (Signed)
CPE- See plan.  Routine anticipatory guidance given to patient.  See health maintenance.  The possibility exists that previously documented standard health maintenance information may have been brought forward from a previous encounter into this note.  If needed, that same information has been updated to reflect the current situation based on today's encounter.    Tetanus 2017 Flu done at pharmacy 07/04/18  PNA due at 67 shingrix d/w pt.  Out of stock.   Mammogram encouraged.  see avs.   DXA due at 67 Pap d/w pt. Per GYN, d/w pt.  Done ~2016 per patient report.  Reasonable for 5 year f/u, per [facility]. Husband designated if patient were incapacitated.  HCV prev neg.   HIV declined by patient.   Migraines.  Rare use of imitrex.  With relief.  No adverse effect on medication.  Hypertension:    Using medication without problems or lightheadedness: yes Chest pain with exertion:no Edema:no Short of breath:no Her BP is normally controlled at home.    Memory d/w pt.  Recently back from trip to [place], with ability to navigate a foreign country and she did well. No apparent deficits.  Prev with normal noncontrast CT scan of the brain.   Fatty liver hx d/w pt.  Mild LFT elevation.  Discussed with patient.  Encouraged gradual weight loss with diet and exercise.  She is seeing ortho about R hip pain.  She was prev injected.   PMH and SH reviewed  Meds, vitals, and allergies reviewed.   ROS: Per HPI.  Unless specifically indicated otherwise in HPI, the patient denies:  General: fever. Eyes: acute vision changes ENT: sore throat Cardiovascular: chest pain Respiratory: SOB GI: vomiting GU: dysuria Musculoskeletal: acute back pain Derm: acute rash Neuro: acute motor dysfunction Psych: worsening mood Endocrine: polydipsia Heme: bleeding Allergy: hayfever  GEN: nad, alert and oriented HEENT: mucous membranes moist NECK: supple w/o LA CV: rrr. PULM: ctab, no inc wob ABD:  soft, +bs EXT: no edema SKIN: no acute rash Hip exam deferred due to concern for patient comfort.

## 2018-09-05 NOTE — Patient Instructions (Addendum)
You can call for a mammogram [place] at [facility].  [place]  Follow up with ortho.  When your hip is better, let me know so we can make plans about a possible low dose statin trial to see if you can tolerate it.  Goal for health carbs/fats/proteins in the meantime.  Update me if your BP is persistently >140>90.  Take care.  Glad to see you.

## 2018-09-07 DIAGNOSIS — Z789 Other specified health status: Secondary | ICD-10-CM | POA: Insufficient documentation

## 2018-09-07 DIAGNOSIS — M1611 Unilateral primary osteoarthritis, right hip: Secondary | ICD-10-CM | POA: Diagnosis not present

## 2018-09-07 DIAGNOSIS — M25551 Pain in right hip: Secondary | ICD-10-CM | POA: Diagnosis not present

## 2018-09-07 DIAGNOSIS — M7611 Psoas tendinitis, right hip: Secondary | ICD-10-CM | POA: Diagnosis not present

## 2018-09-07 NOTE — Assessment & Plan Note (Signed)
Discussed with patient about labs and diet and exercise.

## 2018-09-07 NOTE — Assessment & Plan Note (Signed)
She will follow-up with Ortho about her hip pain and after that she can update me about her situation so we can consider a low-dose of a statin to see if she can tolerate it.  Rationale discussed.

## 2018-09-07 NOTE — Assessment & Plan Note (Signed)
No change in meds.  Labs discussed with patient.  Continue work on diet and exercise.  See after visit summary.  She can update me if her blood pressures persistently elevated.

## 2018-09-07 NOTE — Assessment & Plan Note (Signed)
Memory d/w pt.  Recently back from trip to [place], with ability to navigate a foreign country and she did well. No apparent deficits.  Prev with normal noncontrast CT scan of the brain.

## 2018-09-07 NOTE — Assessment & Plan Note (Signed)
Husband designated if patient were incapacitated. 

## 2018-09-07 NOTE — Assessment & Plan Note (Signed)
Rare use of imitrex.  With relief.  No adverse effect on medication.

## 2018-09-07 NOTE — Assessment & Plan Note (Signed)
Tetanus 2017 Flu done at pharmacy 07/04/18  PNA due at 67 shingrix d/w pt.  Out of stock.   Mammogram encouraged.  see avs.   DXA due at 67 Pap d/w pt. Per GYN, d/w pt.  Done ~2016 per patient report.  Reasonable for 5 year f/u, per [facility] OB/[facility]. Husband designated if patient were incapacitated.  HCV prev neg.   HIV declined by patient.

## 2018-10-19 IMAGING — MR MR HIP*R* W/CM
4 of 6 series · 19 of 40 positions shown · IV contrast (agent unspecified)
Comparison: Radiographs dated 05/31/2017

CLINICAL DATA: Chronic right hip pain.

EXAM:
MRI OF THE RIGHT HIP WITH CONTRAST(MR Arthrogram)
TECHNIQUE: Multiplanar, multisequence MR imaging of the hip was performed
immediately following contrast injection into the hip joint under
fluoroscopic guidance. No intravenous contrast was administered.

[Series 3: T2 fat-sat · coronal · 4.0mm · 0.74mm/px · 6 of 21 slices shown]
[im 1/21]
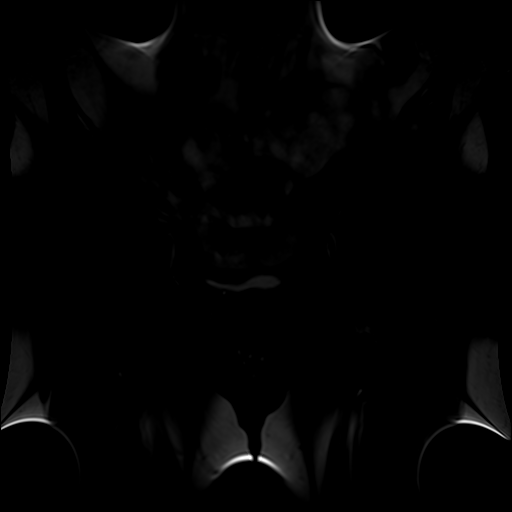
[im 5/21]
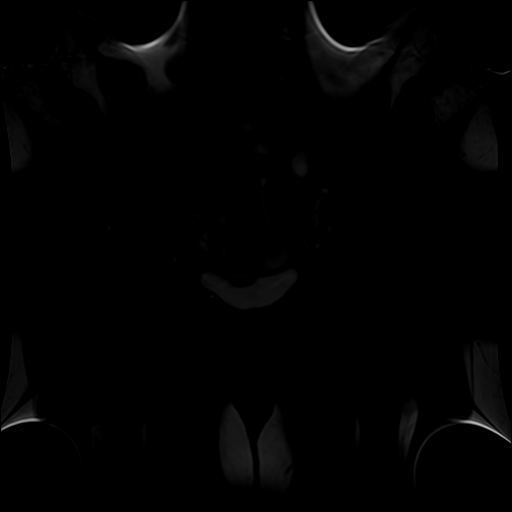
[im 9/21]
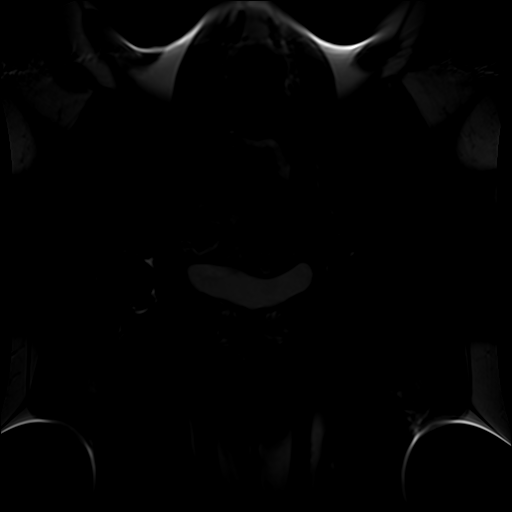
[im 13/21]
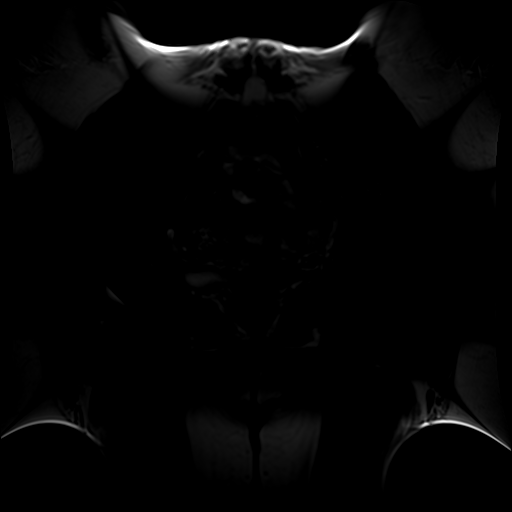
[im 17/21]
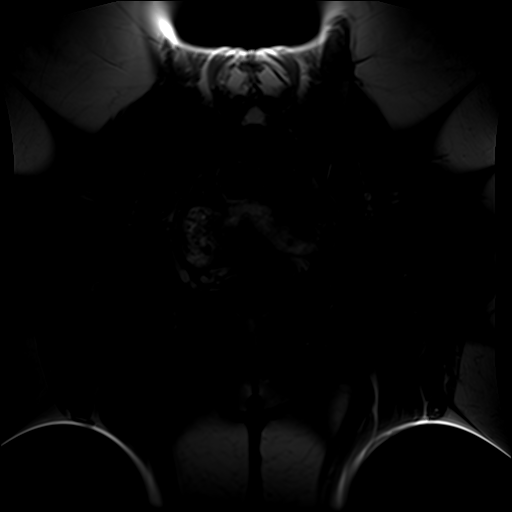
[im 21/21]
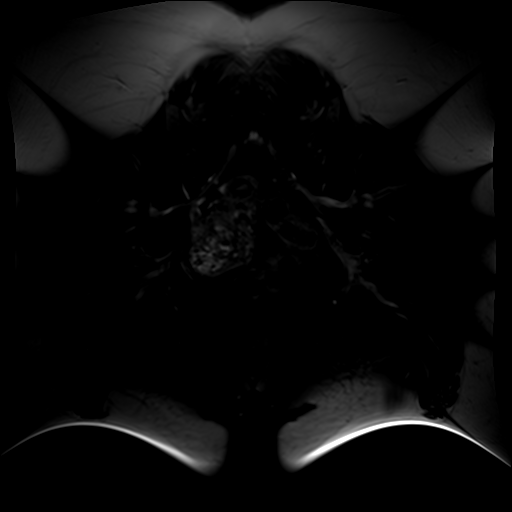

[Series 4: T1 · coronal · 4.0mm · 0.74mm/px · 7 of 21 slices shown]
[im 1/21]
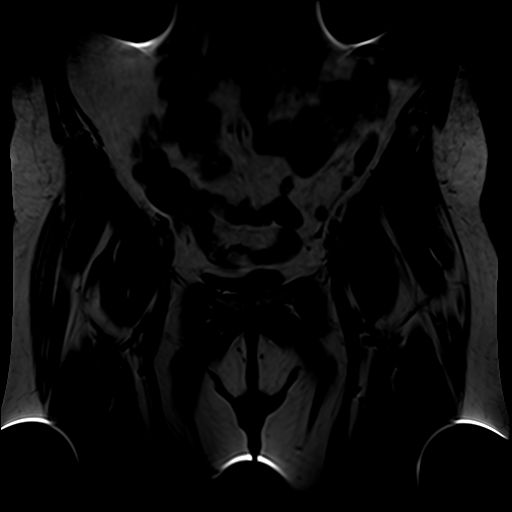
[im 4/21]
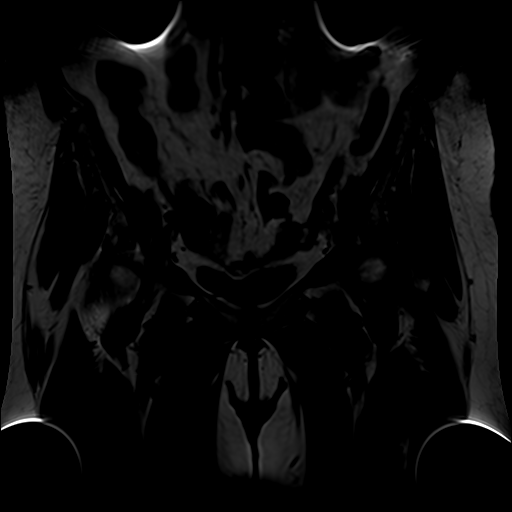
[im 7/21]
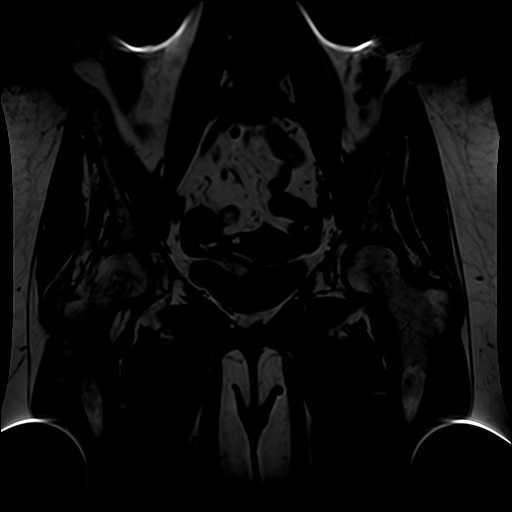
[im 11/21]
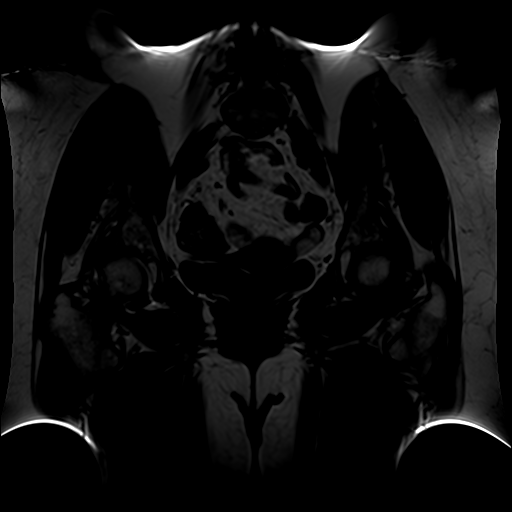
[im 14/21]
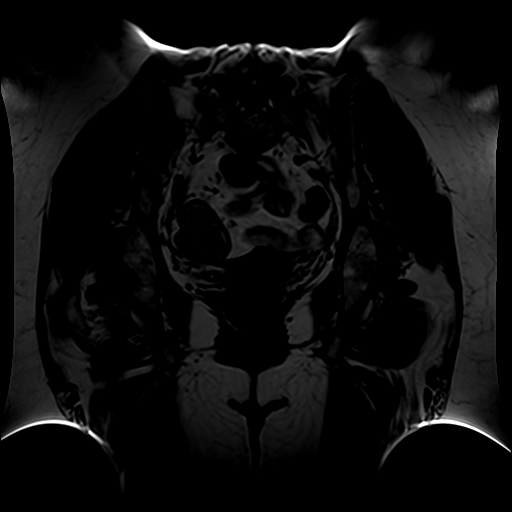
[im 17/21]
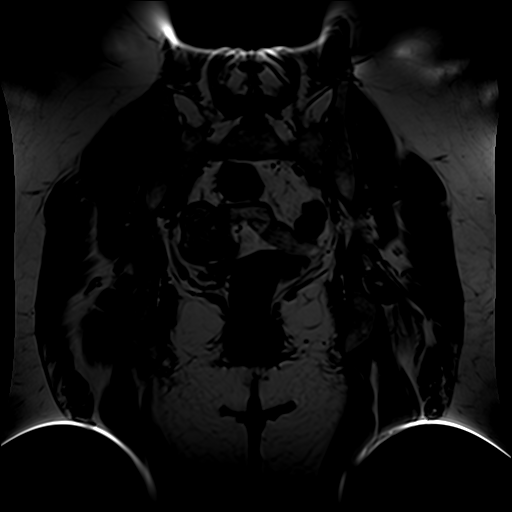
[im 21/21]
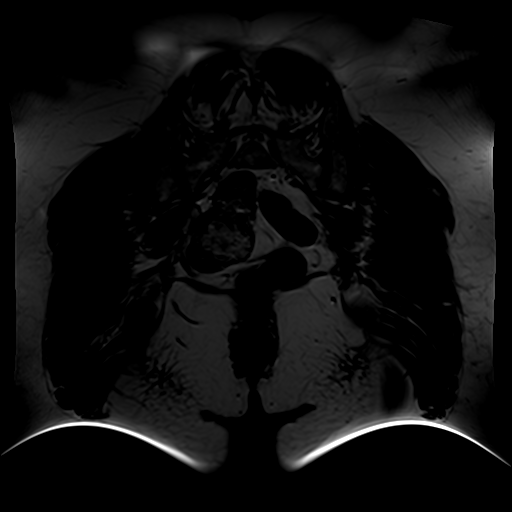

[Series 5: T1 fat-sat · coronal · 4.0mm · 0.35mm/px · 3 of 21 slices shown (1 of 2)]
[im 4/21]
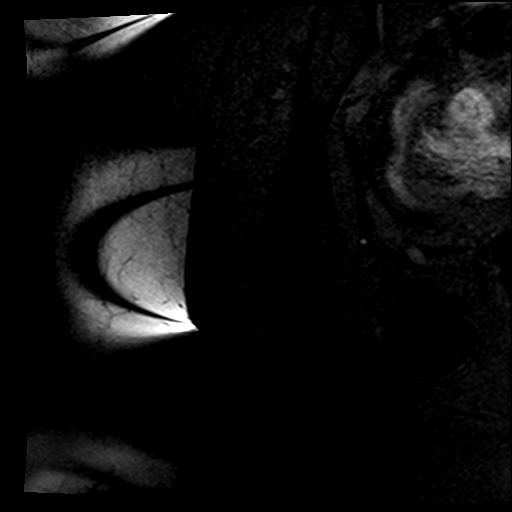
[im 11/21]
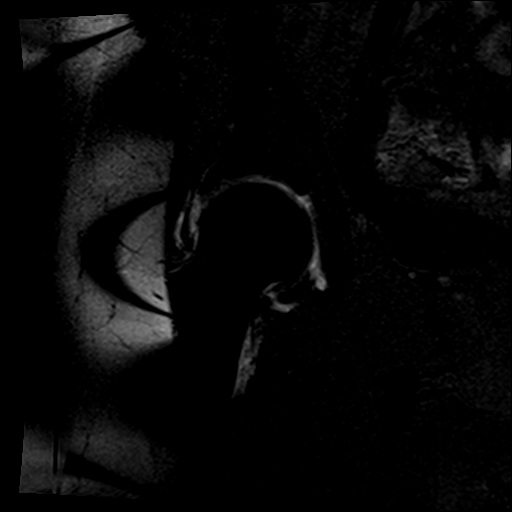
[im 17/21]
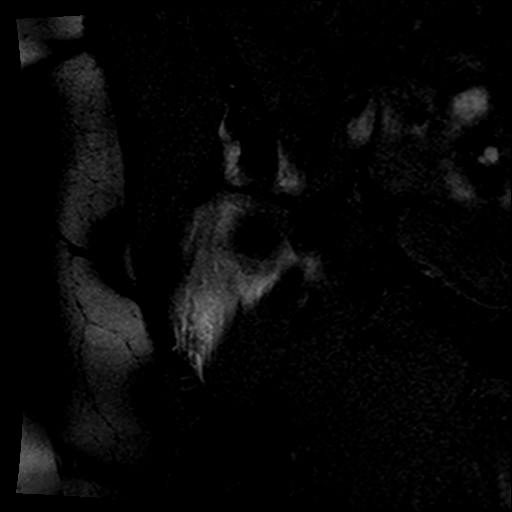

[Series 6: T1 fat-sat · axial · 4.0mm · 0.35mm/px · z∈[-51,+13]mm · 3 of 20 slices shown (2 of 2)]
[im 4/20]
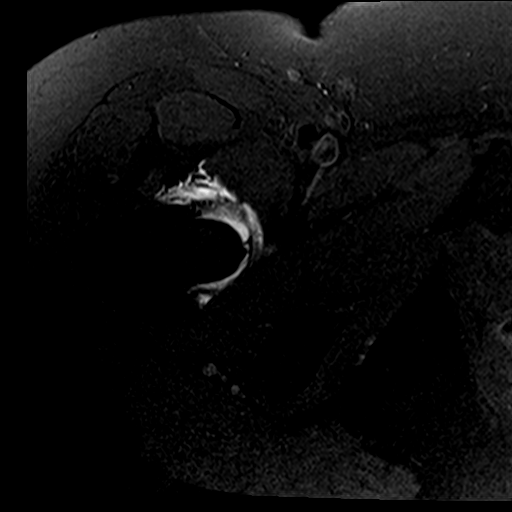
[im 12/20]
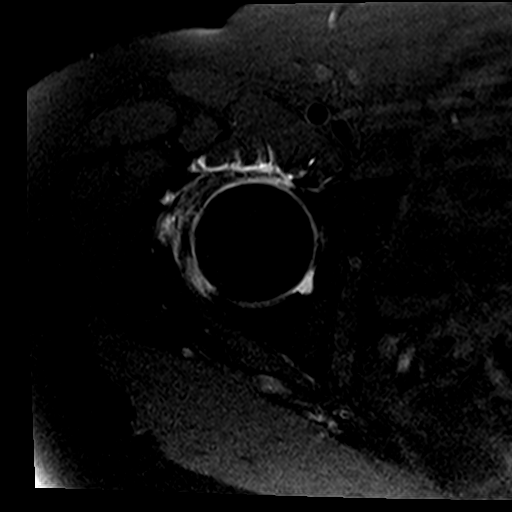
[im 20/20]
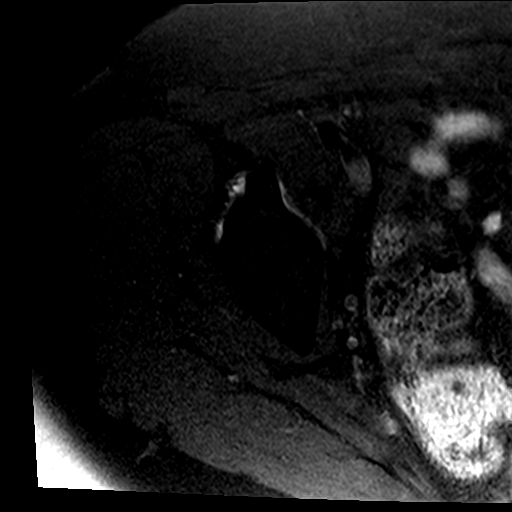

[19 of 40 positions shown; findings below may reference images not displayed]

FINDINGS: Labrum:  Normal.

Bones: Minimal marginal osteophytes on the right femoral head.
Slight thinning of the articular cartilage of the femoral head.
Minimal subcortical edema at the superolateral aspect of the
acetabulum

Muscles and tendons:  Normal.

Bursae:  Normal.

Note is made that some of the injected contrast is extra capsular.
However, I feel the study is diagnostic.
IMPRESSION: 1. Mild arthritic changes of the right hip as described above.
2. Intact labrum.

## 2018-10-19 IMAGING — XA DG FLUORO GUIDE NDL PLC/BX
2 series · 2 of 2 positions shown · non-contrast
Comparison: none

CLINICAL DATA: Right hip pain.

[Series 1: ortho standard · 1 of 1 slices shown (1 of 2)]
[im 1/1]
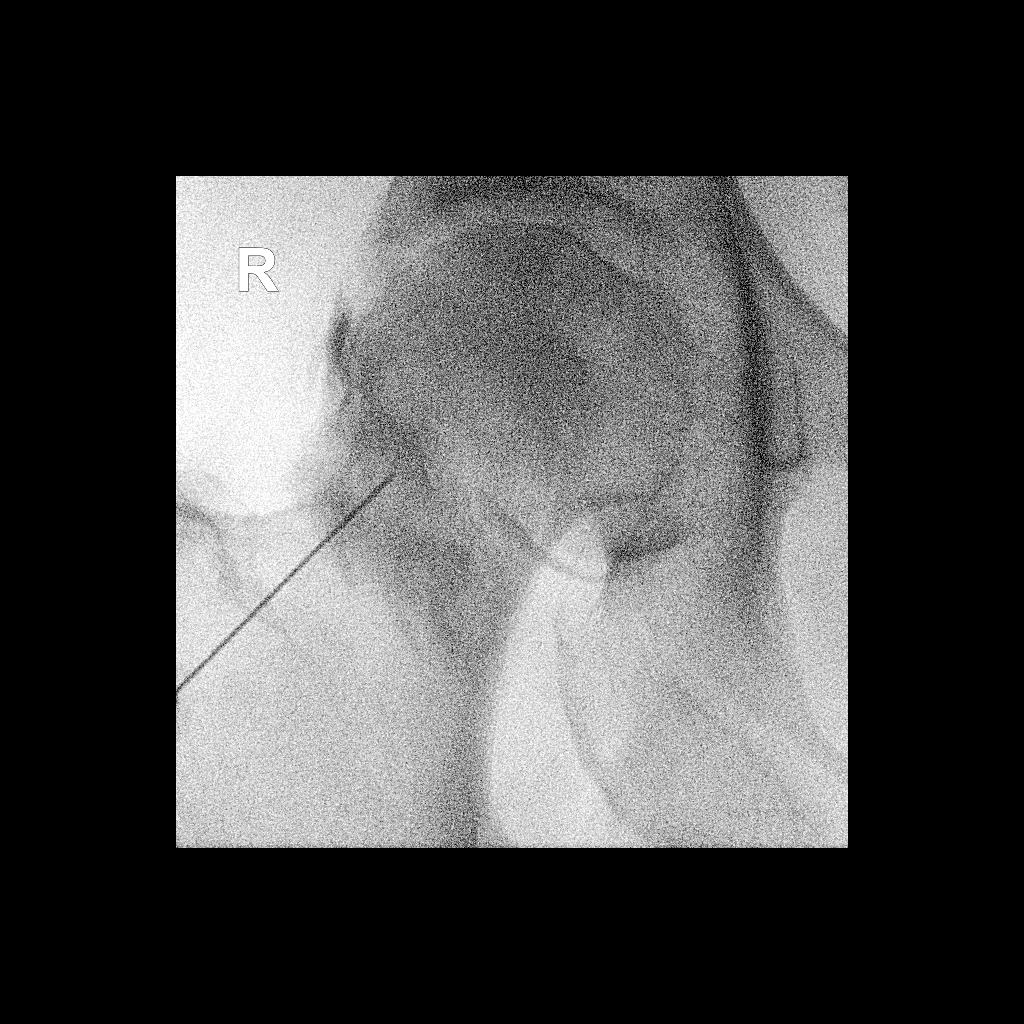

[Series 2: ortho standard · 1 of 1 slices shown (2 of 2)]
[im 1/1]
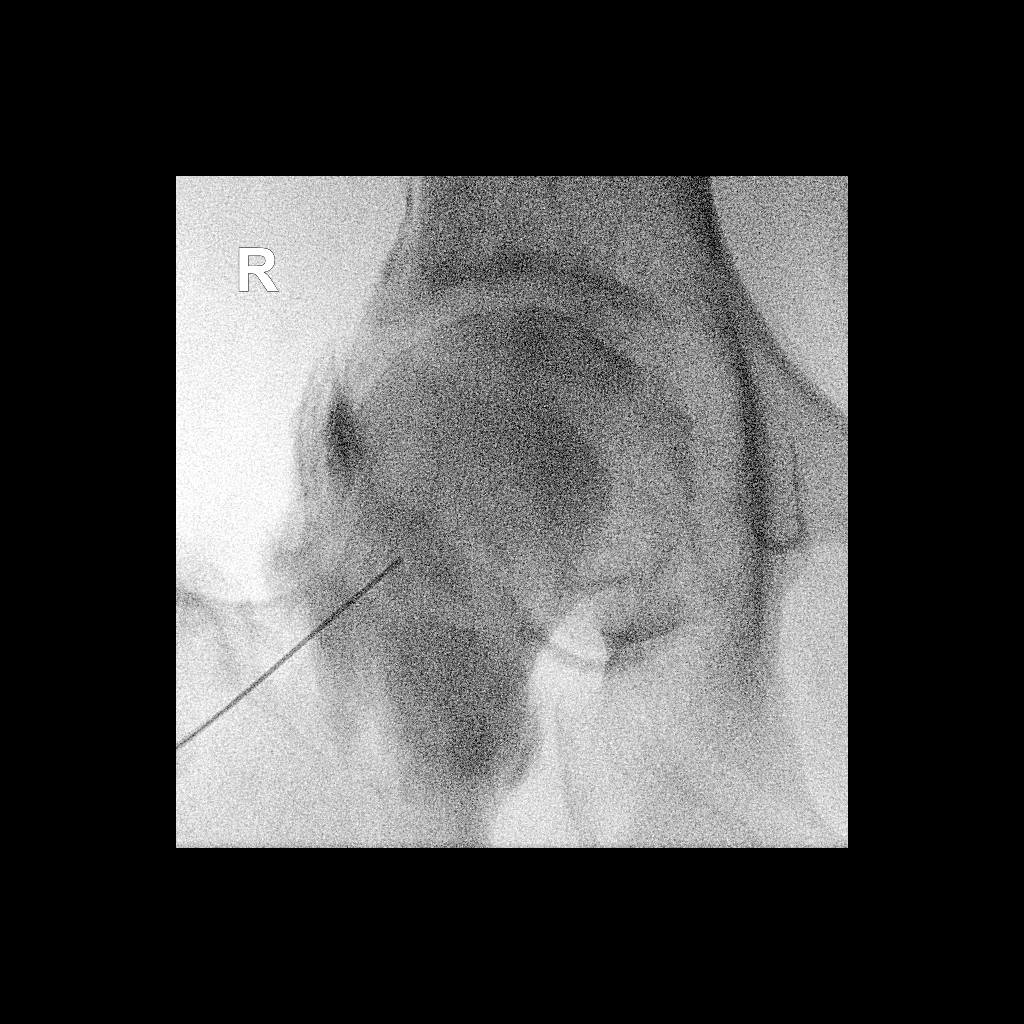

[2 of 2 positions shown; findings below may reference images not displayed]

EXAM:
RIGHT HIP INJECTION FOR MRI

FLUOROSCOPY TIME:  Radiation Exposure Index (if provided by the
fluoroscopic device): 4.76 microGray*m^2

Number of Acquired Spot Images: 0

PROCEDURE:
Overlying skin prepped with Betadine, draped in the usual sterile
fashion, and infiltrated locally with Lidocaine. A 3.5 inch 22 gauge
spinal needle was advanced to the lateral aspect of the right
femoral head-neck junction. 1 mL of 1% lidocaine injected easily. A
mixture of 0.05 mL of MultiHance, 5 mL of 1% lidocaine, and 15 mL of
Isovue-M 200 was then used to opacify the right femoral head. 11 mL
of this mixture were injected. No immediate complication.
IMPRESSION: Technically successful right hip injection under fluoroscopy for MR
arthrogram.

## 2019-02-28 ENCOUNTER — Other Ambulatory Visit: Payer: Self-pay | Admitting: Family Medicine

## 2019-03-04 ENCOUNTER — Other Ambulatory Visit: Payer: Self-pay | Admitting: Family Medicine

## 2019-03-06 NOTE — Telephone Encounter (Signed)
Electronic refill request. Sumatriptan Last office visit:   09/05/2018 CPE Last Filled:   10 tablet 3 06/28/2018  Please advise.

## 2019-03-07 NOTE — Telephone Encounter (Signed)
Sent. Thanks.   

## 2019-03-15 ENCOUNTER — Other Ambulatory Visit: Payer: Self-pay | Admitting: Family Medicine

## 2019-03-15 NOTE — Telephone Encounter (Signed)
LOV 09/05/2018 for CPE. No future appointments. Last filled on 03/14/2018 for #60 with 2 refills.

## 2019-03-15 NOTE — Telephone Encounter (Signed)
Sent. Thanks.  Not yet due for follow-up yearly visit.

## 2019-03-20 ENCOUNTER — Other Ambulatory Visit: Payer: Self-pay | Admitting: Family Medicine

## 2019-06-03 ENCOUNTER — Other Ambulatory Visit: Payer: Self-pay | Admitting: Family Medicine

## 2019-06-06 ENCOUNTER — Other Ambulatory Visit: Payer: Self-pay | Admitting: Family Medicine

## 2019-06-08 ENCOUNTER — Encounter: Payer: Self-pay | Admitting: Family Medicine

## 2019-06-08 ENCOUNTER — Ambulatory Visit: Payer: 59 | Admitting: Family Medicine

## 2019-06-08 ENCOUNTER — Other Ambulatory Visit: Payer: Self-pay

## 2019-06-08 VITALS — BP 150/98 | HR 82 | Temp 98.3°F | Ht 66.0 in | Wt 188.0 lb

## 2019-06-08 DIAGNOSIS — G43909 Migraine, unspecified, not intractable, without status migrainosus: Secondary | ICD-10-CM | POA: Diagnosis not present

## 2019-06-08 DIAGNOSIS — I10 Essential (primary) hypertension: Secondary | ICD-10-CM | POA: Diagnosis not present

## 2019-06-08 DIAGNOSIS — Z23 Encounter for immunization: Secondary | ICD-10-CM | POA: Diagnosis not present

## 2019-06-08 DIAGNOSIS — E785 Hyperlipidemia, unspecified: Secondary | ICD-10-CM

## 2019-06-08 LAB — COMPREHENSIVE METABOLIC PANEL
ALT: 43 U/L — ABNORMAL HIGH (ref 0–35)
AST: 24 U/L (ref 0–37)
Albumin: 4.7 g/dL (ref 3.5–5.2)
Alkaline Phosphatase: 79 U/L (ref 39–117)
BUN: 18 mg/dL (ref 6–23)
CO2: 28 mEq/L (ref 19–32)
Calcium: 9.9 mg/dL (ref 8.4–10.5)
Chloride: 103 mEq/L (ref 96–112)
Creatinine, Ser: 0.81 mg/dL (ref 0.40–1.20)
GFR: 71.85 mL/min (ref >60.00)
Glucose, Bld: 93 mg/dL (ref 70–99)
Potassium: 3.8 mEq/L (ref 3.5–5.1)
Sodium: 139 mEq/L (ref 135–145)
Total Bilirubin: 1.7 mg/dL — ABNORMAL HIGH (ref 0.2–1.2)
Total Protein: 7.3 g/dL (ref 6.0–8.3)

## 2019-06-08 LAB — LIPID PANEL
Cholesterol: 237 mg/dL — ABNORMAL HIGH (ref 0–200)
HDL: 54.9 mg/dL (ref >39.00)
LDL Cholesterol: 145 mg/dL — ABNORMAL HIGH (ref 0–99)
NonHDL: 182.06
Total CHOL/HDL Ratio: 4
Triglycerides: 186 mg/dL — ABNORMAL HIGH (ref 0.0–149.0)
VLDL: 37.2 mg/dL (ref 0.0–40.0)

## 2019-06-08 MED ORDER — AMLODIPINE BESYLATE 5 MG PO TABS
5.0000 mg | ORAL_TABLET | Freq: Every day | ORAL | 3 refills | Status: DC
Start: 2019-06-08 — End: 2020-06-06

## 2019-06-08 MED ORDER — LOSARTAN POTASSIUM 50 MG PO TABS: 50.0000 mg | ORAL_TABLET | Freq: Every day | ORAL | 3 refills | Status: DC

## 2019-06-08 MED ORDER — DICLOFENAC SODIUM 75 MG PO TBEC: 75.0000 mg | DELAYED_RELEASE_TABLET | Freq: Two times a day (BID) | ORAL | 3 refills | Status: DC

## 2019-06-08 MED ORDER — SUMATRIPTAN SUCCINATE 50 MG PO TABS: ORAL_TABLET | ORAL | 11 refills | Status: DC

## 2019-06-08 NOTE — Progress Notes (Signed)
Hypertension:    Using medication without problems or lightheadedness: yes Chest pain with exertion:no Edema:no Short of breath:no  She has h/o migraines.  Coffee helps when she has symptoms.  She has used imitrex prn.  She has a possible aura with a light flash on the R side of her vision, intermittently at night.  She has h/o chronic floaters, not changed.    covid cautions d/w pt.   Meds, vitals, and allergies reviewed.   PMH and SH reviewed  ROS: Per HPI unless specifically indicated in ROS section   GEN: nad, alert and oriented HEENT: ncat NECK: supple w/o LA CV: rrr. PULM: ctab, no inc wob ABD: soft, +bs EXT: no edema SKIN: no acute rash  The 10-year ASCVD risk score Mikey Bussing DC Jr., et al., 2013) is: 7.6%   Values used to calculate the score:     Age: 63 years     Sex: Female     Is Non-Hispanic African American: No     Diabetic: No     Tobacco smoker: No     Systolic Blood Pressure: [id number] mmHg     Is BP treated: Yes     HDL Cholesterol: 54.9 mg/dL     Total Cholesterol: 237 mg/dL

## 2019-06-08 NOTE — Patient Instructions (Addendum)
Please call about follow up at the eye clinic.   Don't change your meds for now.  Update me about your BP in about 1 week with some extra readings.  Take care.  Glad to see you.

## 2019-06-11 NOTE — Assessment & Plan Note (Signed)
See notes on labs. 

## 2019-06-11 NOTE — Assessment & Plan Note (Signed)
Continue work on diet and exercise.  No change in meds at this point.  Would continue Imitrex as needed.

## 2019-06-11 NOTE — Assessment & Plan Note (Addendum)
See notes on labs.  No change in meds at this point.  Continue work on diet and exercise.  I want her to update me with her blood pressure readings at home.  We talked about not making med adjustments based on 1 reading.  >25 minutes spent in face to face time with patient, >50% spent in counselling or coordination of care.

## 2019-06-14 ENCOUNTER — Other Ambulatory Visit: Payer: Self-pay | Admitting: Family Medicine

## 2019-06-14 DIAGNOSIS — E785 Hyperlipidemia, unspecified: Secondary | ICD-10-CM

## 2019-06-14 MED ORDER — PRAVASTATIN SODIUM 10 MG PO TABS: 10.0000 mg | ORAL_TABLET | Freq: Every day | ORAL | 3 refills | Status: DC

## 2020-04-26 DIAGNOSIS — Z20822 Contact with and (suspected) exposure to covid-19: Secondary | ICD-10-CM | POA: Diagnosis not present

## 2020-04-26 DIAGNOSIS — R05 Cough: Secondary | ICD-10-CM | POA: Diagnosis not present

## 2020-04-26 DIAGNOSIS — R519 Headache, unspecified: Secondary | ICD-10-CM | POA: Diagnosis not present

## 2020-04-26 DIAGNOSIS — Z03818 Encounter for observation for suspected exposure to other biological agents ruled out: Secondary | ICD-10-CM | POA: Diagnosis not present

## 2020-06-06 ENCOUNTER — Other Ambulatory Visit: Payer: Self-pay | Admitting: Family Medicine

## 2020-06-14 ENCOUNTER — Other Ambulatory Visit: Payer: Self-pay | Admitting: Family Medicine

## 2020-06-14 NOTE — Telephone Encounter (Signed)
Electronic refill request. Sumatriptan Last office visit:   06/08/2019 Last Filled:    10 tablet 11 06/08/2019  No future appointments scheduled.

## 2020-06-16 NOTE — Telephone Encounter (Signed)
Sent. Thanks.  Needs CPE scheduled.   

## 2020-06-17 NOTE — Telephone Encounter (Signed)
Please call and schedule appointment as instructed. 

## 2020-06-17 NOTE — Telephone Encounter (Signed)
Patient is scheduled EM 

## 2020-07-14 ENCOUNTER — Other Ambulatory Visit: Payer: Self-pay | Admitting: Family Medicine

## 2020-07-14 DIAGNOSIS — I10 Essential (primary) hypertension: Secondary | ICD-10-CM

## 2020-07-15 ENCOUNTER — Other Ambulatory Visit: Payer: Self-pay

## 2020-07-15 ENCOUNTER — Other Ambulatory Visit (INDEPENDENT_AMBULATORY_CARE_PROVIDER_SITE_OTHER): Payer: BC Managed Care – PPO

## 2020-07-15 DIAGNOSIS — I10 Essential (primary) hypertension: Secondary | ICD-10-CM

## 2020-07-15 LAB — COMPREHENSIVE METABOLIC PANEL
ALT: 23 U/L (ref 0–35)
AST: 18 U/L (ref 0–37)
Albumin: 4.4 g/dL (ref 3.5–5.2)
Alkaline Phosphatase: 74 U/L (ref 39–117)
BUN: 13 mg/dL (ref 6–23)
CO2: 28 mEq/L (ref 19–32)
Calcium: 9.6 mg/dL (ref 8.4–10.5)
Chloride: 106 mEq/L (ref 96–112)
Creatinine, Ser: 0.73 mg/dL (ref 0.40–1.20)
GFR: 88.24 mL/min (ref >60.00)
Glucose, Bld: 88 mg/dL (ref 70–99)
Potassium: 3.7 mEq/L (ref 3.5–5.1)
Sodium: 141 mEq/L (ref 135–145)
Total Bilirubin: 1.4 mg/dL — ABNORMAL HIGH (ref 0.2–1.2)
Total Protein: 7.1 g/dL (ref 6.0–8.3)

## 2020-07-15 LAB — LIPID PANEL
Cholesterol: 234 mg/dL — ABNORMAL HIGH (ref 0–200)
HDL: 64.3 mg/dL (ref >39.00)
LDL Cholesterol: 141 mg/dL — ABNORMAL HIGH (ref 0–99)
NonHDL: 170.13
Total CHOL/HDL Ratio: 4
Triglycerides: 144 mg/dL (ref 0.0–149.0)
VLDL: 28.8 mg/dL (ref 0.0–40.0)

## 2020-07-22 ENCOUNTER — Ambulatory Visit (INDEPENDENT_AMBULATORY_CARE_PROVIDER_SITE_OTHER): Payer: BC Managed Care – PPO | Admitting: Family Medicine

## 2020-07-22 ENCOUNTER — Other Ambulatory Visit: Payer: Self-pay

## 2020-07-22 ENCOUNTER — Encounter: Payer: Self-pay | Admitting: Family Medicine

## 2020-07-22 VITALS — BP 150/80 | HR 87 | Temp 99.0°F | Ht 67.5 in | Wt 179.6 lb

## 2020-07-22 DIAGNOSIS — Z0000 Encounter for general adult medical examination without abnormal findings: Secondary | ICD-10-CM | POA: Diagnosis not present

## 2020-07-22 DIAGNOSIS — G43909 Migraine, unspecified, not intractable, without status migrainosus: Secondary | ICD-10-CM

## 2020-07-22 DIAGNOSIS — M25569 Pain in unspecified knee: Secondary | ICD-10-CM

## 2020-07-22 DIAGNOSIS — Z23 Encounter for immunization: Secondary | ICD-10-CM | POA: Diagnosis not present

## 2020-07-22 DIAGNOSIS — Z1211 Encounter for screening for malignant neoplasm of colon: Secondary | ICD-10-CM

## 2020-07-22 DIAGNOSIS — I8390 Asymptomatic varicose veins of unspecified lower extremity: Secondary | ICD-10-CM

## 2020-07-22 DIAGNOSIS — K219 Gastro-esophageal reflux disease without esophagitis: Secondary | ICD-10-CM

## 2020-07-22 DIAGNOSIS — E785 Hyperlipidemia, unspecified: Secondary | ICD-10-CM

## 2020-07-22 DIAGNOSIS — Z7189 Other specified counseling: Secondary | ICD-10-CM

## 2020-07-22 DIAGNOSIS — I10 Essential (primary) hypertension: Secondary | ICD-10-CM

## 2020-07-22 DIAGNOSIS — K760 Fatty (change of) liver, not elsewhere classified: Secondary | ICD-10-CM

## 2020-07-22 MED ORDER — LOSARTAN POTASSIUM 50 MG PO TABS
50.0000 mg | ORAL_TABLET | Freq: Every day | ORAL | 3 refills | Status: DC
Start: 2020-07-22 — End: 2021-06-06

## 2020-07-22 MED ORDER — DICLOFENAC SODIUM 75 MG PO TBEC
75.0000 mg | DELAYED_RELEASE_TABLET | Freq: Two times a day (BID) | ORAL | 3 refills | Status: DC
Start: 2020-07-22 — End: 2021-05-27

## 2020-07-22 MED ORDER — AMLODIPINE BESYLATE 5 MG PO TABS: 5.0000 mg | ORAL_TABLET | Freq: Every day | ORAL | 3 refills | Status: DC

## 2020-07-22 MED ORDER — SUMATRIPTAN SUCCINATE 50 MG PO TABS
ORAL_TABLET | ORAL | 5 refills | Status: DC
Start: 2020-07-22 — End: 2021-06-06

## 2020-07-22 NOTE — Progress Notes (Signed)
This visit occurred during the SARS-CoV-2 public health emergency.  Safety protocols were in place, including screening questions prior to the visit, additional usage of staff PPE, and extensive cleaning of exam room while observing appropriate contact time as indicated for disinfecting solutions.  CPE- See plan.  Routine anticipatory guidance given to patient.  See health maintenance.  The possibility exists that previously documented standard health maintenance information may have been brought forward from a previous encounter into this note.  If needed, that same information has been updated to reflect the current situation based on today's encounter.    Tetanus 2017 Flu 2021 PNA due at 67 shingrix d/w pt. covid 2021 Mammogram encouraged.  I asked her to call GYN about scheduling.   DXA due at 67 Colonoscopy d/w pt.  Referral placed.   Pap d/w pt. Per GYN, d/w pt. Done ~2016 per patient report. Reasonable for 5 year f/u, per [facility]. Husband designated if patient were incapacitated.   Migraines.  Rare use of imitrex.  With relief.  No adverse effect on medication.  Hypertension:               Using medication without problems or lightheadedness: yes Chest pain with exertion:no Edema:no Short of breath:no Her BP is normally controlled at home.    Elevated Cholesterol: Still stopped pravastatin but still has R knee and shoulder pain.  The other joint pains seem to be worse in the last few months.  Not currently taking turmeric. R medial knee pain, worse pain with ext rotation of the hip and with stairs.   One episode when "it gave out" going up stairs with onset of pain.  No locking.  No trauma.  It is getting better.  She doesn't have shoulder pain today but did have that recently. D/w pt about not restarting statin at this point given the joint aches.      She can tolerate 81mg  ASA, d/w pt.  Allergy list updated.    She thought her memory was "fine".  D/w pt.  She has  occ word searching but she thought she was improved from prev. She is working on Energy manager.  And she is still working.  We both think it is unlikely she would be able to do all that if she had significant memory impairment.  We can follow this yearly.  Fatty liver hx d/w pt.  Mild LFT elevation prev, normalized on check recently.  Discussed with patient re: diet and exercise.    She has nocturnal GERD.  Head of bed is elevated.  Taking pepcid at baseline.  D/w pt about trial of PPI vs BID H2 blocker.  Routine cautions d/w pt.    PMH and SH reviewed  Meds, vitals, and allergies reviewed.   ROS: Per HPI.  Unless specifically indicated otherwise in HPI, the patient denies:  General: fever. Eyes: acute vision changes ENT: sore throat Cardiovascular: chest pain Respiratory: SOB GI: vomiting GU: dysuria Musculoskeletal: acute back pain Derm: acute rash Neuro: acute motor dysfunction Psych: worsening mood Endocrine: polydipsia Heme: bleeding Allergy: hayfever  GEN: nad, alert and oriented HEENT: ncat NECK: supple w/o LA CV: rrr. PULM: ctab, no inc wob ABD: soft, +bs EXT: no edema SKIN: no acute rash R knee puffy but normal exam on ROM.  Patella not ttp. Medial joint line ttp.  Pain on valgus but not varus stress.  ACL feels solid.  Varicose vein on L popliteal area, tender locally when squatting.

## 2020-07-22 NOTE — Patient Instructions (Addendum)
Let me know when you need a referral to the vein clinic.  Restart knee sleeve and turmeric and ice for 5 minutes at a time.  We'll call about ortho.  Please call about f/u with gynecology.   Take care.  Glad to see you.

## 2020-07-24 DIAGNOSIS — I8390 Asymptomatic varicose veins of unspecified lower extremity: Secondary | ICD-10-CM | POA: Insufficient documentation

## 2020-07-24 DIAGNOSIS — K219 Gastro-esophageal reflux disease without esophagitis: Secondary | ICD-10-CM | POA: Insufficient documentation

## 2020-07-24 DIAGNOSIS — I8310 Varicose veins of unspecified lower extremity with inflammation: Secondary | ICD-10-CM | POA: Insufficient documentation

## 2020-07-24 DIAGNOSIS — M25569 Pain in unspecified knee: Secondary | ICD-10-CM | POA: Insufficient documentation

## 2020-07-24 NOTE — Assessment & Plan Note (Signed)
She can let me know when she wants referral to the vein clinic.  No emergent symptoms.

## 2020-07-24 NOTE — Assessment & Plan Note (Signed)
Fatty liver hx d/w pt.  Mild LFT elevation prev, normalized on check recently.  Discussed with patient re: diet and exercise.

## 2020-07-24 NOTE — Assessment & Plan Note (Signed)
We can attribute her current aches to statin use but we did not want to cloud the picture by restarting her statin at this point.  Labs discussed with patient.

## 2020-07-24 NOTE — Assessment & Plan Note (Signed)
Rare use of imitrex.  With relief.  No adverse effect on medication.  Would continue as needed use.

## 2020-07-24 NOTE — Assessment & Plan Note (Signed)
Given her symptoms, I think it is reasonable to see orthopedics.  We deferred imaging to the orthopedic clinic.  She agrees with plan.  Once her knee pain/joint pain is addressed and we can consider statin restart in the future.  She agrees with plan.  See after visit summary regarding icing and using a sleeve on her knee.

## 2020-07-24 NOTE — Assessment & Plan Note (Signed)
Tetanus 2017 Flu 2021 PNA due at 67 shingrix d/w pt. covid 2021 Mammogram encouraged.  I asked her to call GYN about scheduling.   DXA due at 67 Colonoscopy d/w pt.  Referral placed.   Pap d/w pt. Per GYN, d/w pt. Done ~2016 per patient report. Reasonable for 5 year f/u, per [facility]. Husband designated if patient were incapacitated.

## 2020-07-24 NOTE — Assessment & Plan Note (Signed)
  She has nocturnal GERD.  Head of bed is elevated.  Taking pepcid at baseline.  D/w pt about trial of PPI vs BID H2 blocker.  Routine cautions d/w pt. she can update me as needed.

## 2020-07-24 NOTE — Assessment & Plan Note (Signed)
Blood pressure is usually controlled at home.  Would continue amlodipine and losartan.  Continue work on diet and exercise.

## 2020-07-24 NOTE — Assessment & Plan Note (Signed)
Husband designated if patient were incapacitated. 

## 2020-07-29 ENCOUNTER — Telehealth: Payer: Self-pay

## 2020-07-29 NOTE — Telephone Encounter (Signed)
Pt left v/m that she was exposed to covid 4 days; pt has been quarantining since exposure. pts son and law just became positive today but pts daughter and grandchildren were positive on 07/26/20 Prior to that. Pt has done home testing which is negative. Pt has high blood pressure and is 64 and is concerned if positive should she have infusion. Pt still does not have symptoms but scheduled a video visit 07/30/20 at 11:40 with Dr Diona Browner since tomorrow is day 5 from exposure.UC. UC & ED parecautions given and pt voiced understanding.

## 2020-07-30 ENCOUNTER — Encounter: Payer: Self-pay | Admitting: Family Medicine

## 2020-07-30 ENCOUNTER — Telehealth (INDEPENDENT_AMBULATORY_CARE_PROVIDER_SITE_OTHER): Payer: BC Managed Care – PPO | Admitting: Family Medicine

## 2020-07-30 VITALS — HR 78 | Temp 98.0°F | Ht 67.5 in | Wt 176.5 lb

## 2020-07-30 DIAGNOSIS — Z20822 Contact with and (suspected) exposure to covid-19: Secondary | ICD-10-CM | POA: Diagnosis not present

## 2020-07-30 MED ORDER — ALBUTEROL SULFATE HFA 108 (90 BASE) MCG/ACT IN AERS: 2.0000 | INHALATION_SPRAY | Freq: Four times a day (QID) | RESPIRATORY_TRACT | 0 refills | Status: DC | PRN

## 2020-07-30 NOTE — Telephone Encounter (Signed)
Noted  

## 2020-07-30 NOTE — Assessment & Plan Note (Signed)
Asymptomatic. Questions answered.  Given history if PCR test  positive for COVID.Yolanda Delgado. she is a candidate for  MAB infusion. Sent in refill of albuterol to have available as she has history of reactive airways.

## 2020-07-30 NOTE — Progress Notes (Signed)
VIRTUAL VISIT Due to national recommendations of social distancing due to [place] 19, a virtual visit is felt to be most appropriate for this patient at this time.   I connected with the patient on 07/30/20 at 11:40 AM EST by virtual telehealth platform and verified that I am speaking with the correct person using two identifiers.   I discussed the limitations, risks, security and privacy concerns of performing an evaluation and management service by  virtual telehealth platform and the availability of in person appointments. I also discussed with the patient that there may be a patient responsible charge related to this service. The patient expressed understanding and agreed to proceed.  Patient location: Home Provider Location: [place] Participants: Eliezer Lofts and Jonna Munro   Chief Complaint  Patient presents with  . Covid Exposure    07/25/2020-Daughter, Granddaugher and Grandson all tested positive on 07/26/2020-5 negative home test so far    History of Present Illness:  64 year old female pt of Dr. Josefine Class presents following COVID exposure.   She was around her daughter, granddaughter, grandson on 12/2 evening... daughter had some congestion. They tested positive on 07/26/2020   She has no symptoms and has had negative COVID home tests x 5. She has no symptoms at this point.   Has history of HTN, no chronic lung disease.   S/P JJ vaccine in 12/2019  She has had some asthmatic bronchitis in past.. needed albuterol inhaler. Requests refill.  COVID 19 screen No recent travel or known exposure to COVID19 The patient denies respiratory symptoms of COVID 19 at this time.  The importance of social distancing was discussed today.   Review of Systems  Constitutional: Negative for chills and fever.  HENT: Negative for congestion and ear pain.   Eyes: Negative for pain and redness.  Respiratory: Negative for cough and shortness of breath.   Cardiovascular: Negative  for chest pain, palpitations and leg swelling.  Gastrointestinal: Negative for abdominal pain, blood in stool, constipation, diarrhea, nausea and vomiting.  Genitourinary: Negative for dysuria.  Musculoskeletal: Negative for falls and myalgias.  Skin: Negative for rash.  Neurological: Negative for dizziness.  Psychiatric/Behavioral: Negative for depression. The patient is not nervous/anxious.       Past Medical History:  Diagnosis Date  . Colon polyp   . Family history of B12 deficiency   . Fatty liver   . Hyperlipidemia   . Hypertension   . Hypokalemia    with diuretic use  . Migraine   . Ocular rosacea   . Palpitation    due to caffeine    reports that she has never smoked. She has never used smokeless tobacco. She reports that she does not drink alcohol and does not use drugs.   Current Outpatient Medications:  .  amLODipine (NORVASC) 5 MG tablet, Take 1 tablet (5 mg total) by mouth daily., Disp: 90 tablet, Rfl: 3 .  diclofenac (VOLTAREN) 75 MG EC tablet, Take 1 tablet (75 mg total) by mouth 2 (two) times daily. With food., Disp: 180 tablet, Rfl: 3 .  famotidine (PEPCID) 20 MG tablet, Take 20 mg by mouth 2 (two) times daily., Disp: , Rfl:  .  Ginger, Zingiber officinalis, (GINGER ROOT) 250 MG CAPS, Takes 1 capsules daily, Disp: , Rfl:  .  losartan (COZAAR) 50 MG tablet, Take 1 tablet (50 mg total) by mouth daily., Disp: 90 tablet, Rfl: 3 .  NON FORMULARY, [place] Oil daily., Disp: , Rfl:  .  Nutritional  Supplements (ESTROVEN PO), Take by mouth daily., Disp: , Rfl:  .  SUMAtriptan (IMITREX) 50 MG tablet, May repeat in 2 hours if headache persists or recurs., Disp: 10 tablet, Rfl: 5 .  TURMERIC PO, Take by mouth daily., Disp: , Rfl:    Observations/Objective: Pulse 78, temperature 98 F (36.7 C), temperature source Oral, height 5' 7.5" (1.715 m), weight 176 lb 8 oz (80.1 kg), SpO2 99 %.  Physical Exam  Physical Exam Constitutional:      General: The patient is not in  acute distress. Pulmonary:     Effort: Pulmonary effort is normal. No respiratory distress.  Neurological:     Mental Status: The patient is alert and oriented to person, place, and time.  Psychiatric:        Mood and Affect: Mood normal.        Behavior: Behavior normal.   Assessment and Plan Exposure to COVID-19 virus  Asymptomatic. Questions answered.  Given history if PCR test  positive for COVID.Marland Kitchen. she is a candidate for  MAB infusion. Sent in refill of albuterol to have available as she has history of reactive airways.     I discussed the assessment and treatment plan with the patient. The patient was provided an opportunity to ask questions and all were answered. The patient agreed with the plan and demonstrated an understanding of the instructions.   The patient was advised to call back or seek an in-person evaluation if the symptoms worsen or if the condition fails to improve as anticipated.     Eliezer Lofts, MD

## 2020-08-15 ENCOUNTER — Encounter: Payer: Self-pay | Admitting: Family Medicine

## 2020-08-22 ENCOUNTER — Other Ambulatory Visit: Payer: Self-pay | Admitting: Family Medicine

## 2020-09-24 ENCOUNTER — Other Ambulatory Visit: Payer: Self-pay | Admitting: Family Medicine

## 2020-10-22 ENCOUNTER — Other Ambulatory Visit: Payer: Self-pay | Admitting: Family Medicine

## 2021-05-23 ENCOUNTER — Telehealth: Payer: Self-pay | Admitting: Family Medicine

## 2021-05-23 NOTE — Telephone Encounter (Signed)
ERROR

## 2021-05-27 ENCOUNTER — Other Ambulatory Visit: Payer: Self-pay

## 2021-05-27 ENCOUNTER — Ambulatory Visit: Payer: BC Managed Care – PPO | Admitting: Family Medicine

## 2021-05-27 ENCOUNTER — Encounter: Payer: Self-pay | Admitting: Family Medicine

## 2021-05-27 VITALS — BP 130/88 | HR 91 | Temp 98.3°F | Ht 68.0 in | Wt 192.0 lb

## 2021-05-27 DIAGNOSIS — M7660 Achilles tendinitis, unspecified leg: Secondary | ICD-10-CM

## 2021-05-27 DIAGNOSIS — I10 Essential (primary) hypertension: Secondary | ICD-10-CM

## 2021-05-27 LAB — LIPID PANEL
Cholesterol: 237 mg/dL — ABNORMAL HIGH (ref 0–200)
HDL: 57.6 mg/dL (ref >39.00)
LDL Cholesterol: 152 mg/dL — ABNORMAL HIGH (ref 0–99)
NonHDL: 179.26
Total CHOL/HDL Ratio: 4
Triglycerides: 138 mg/dL (ref 0.0–149.0)
VLDL: 27.6 mg/dL (ref 0.0–40.0)

## 2021-05-27 LAB — COMPREHENSIVE METABOLIC PANEL
ALT: 37 U/L — ABNORMAL HIGH (ref 0–35)
AST: 21 U/L (ref 0–37)
Albumin: 4.3 g/dL (ref 3.5–5.2)
Alkaline Phosphatase: 73 U/L (ref 39–117)
BUN: 18 mg/dL (ref 6–23)
CO2: 27 mEq/L (ref 19–32)
Calcium: 9.6 mg/dL (ref 8.4–10.5)
Chloride: 105 mEq/L (ref 96–112)
Creatinine, Ser: 0.75 mg/dL (ref 0.40–1.20)
GFR: 84.9 mL/min (ref >60.00)
Glucose, Bld: 89 mg/dL (ref 70–99)
Potassium: 3.9 mEq/L (ref 3.5–5.1)
Sodium: 141 mEq/L (ref 135–145)
Total Bilirubin: 1.2 mg/dL (ref 0.2–1.2)
Total Protein: 6.6 g/dL (ref 6.0–8.3)

## 2021-05-27 MED ORDER — DICLOFENAC SODIUM 75 MG PO TBEC
75.0000 mg | DELAYED_RELEASE_TABLET | Freq: Two times a day (BID) | ORAL | 3 refills | Status: DC
Start: 2021-05-27 — End: 2022-06-08

## 2021-05-27 NOTE — Progress Notes (Signed)
This visit occurred during the SARS-CoV-2 public health emergency.  Safety protocols were in place, including screening questions prior to the visit, additional usage of staff PPE, and extensive cleaning of exam room while observing appropriate contact time as indicated for disinfecting solutions.  Knee pain going on about 10 days. It has been hard to walk at times; has used heat and ice on knee and that has helped some. Right hip is also starting to hurt too, presumably from compensating for the left leg.  Taking prn diclofenac recently, with some likely relief.    Recently back from [place].  She was having some B achilles burning in the AM when getting out of bed.  Had been going on for about 1 week prior to the trip.  Did a lot of walking on the trip, walked a lot of hills.  Had L knee pain at lunch, after sitting.  "Heard a pop and my left knee gave out" on standing w/o injury at the time.  She couldn't put weight on the L knee for 2 days.  Start using a brace and crutches.  Iced her knee at the time.  She can bear weight now.  She can walk a few steps now w/o crutches but her R knee is sore now.  L knee didn't bruise.    L achilles pain is better now.    Meds, vitals, and allergies reviewed.   ROS: Per HPI unless specifically indicated in ROS section   Nad Ncat Neck supple, no LA Rrr Ctab Left knee with pain on full flexion but she has full extension.  No click on range of motion.  Normal meniscal testing.  ACL MCL and LCL feel solid.  Medial and lateral joint line not tender.  Patella and quad ligament nontender.  No bruising and no swelling. Right knee with normal flexion and extension, no meniscal click.  ACL MCL and LCL feel solid.  Medial lateral joint line not tender to palpation but she has some tenderness superior to the patella.  Quad ligament not tender. Right Achilles is slightly tender to palpation without any failure of the tendon.  Normal plantar and dorsiflexion.   Left Achilles not tender to palpation.  Medial lateral malleolus not tender to palpation bilaterally.  Walking with a crutch but able to bear weight.  30 minutes were devoted to patient care in this encounter (this includes time spent reviewing the patient's file/history, interviewing and examining the patient, counseling/reviewing plan with patient).

## 2021-05-27 NOTE — Patient Instructions (Addendum)
Go to the lab on the way out.   If you have mychart we'll likely use that to update you.    Keep taking diclofenac with food.   This should gradually get better.   If not then let me know.  Keep using the crutch and icing and bracing.  Take care.  Glad to see you.  Start using a heel lift in both shoes and ice your achilles as needed.  Don't go barefoot.   [employer] supplier in [place], [place] in: [employer] Address: [address], [place], [place] [address] Phone: [phone]

## 2021-05-29 DIAGNOSIS — M7660 Achilles tendinitis, unspecified leg: Secondary | ICD-10-CM | POA: Insufficient documentation

## 2021-05-29 NOTE — Assessment & Plan Note (Signed)
Left knee pain could have been due to a meniscus degeneration and/or osteoarthritis flare.  Improved in the meantime.  Right knee pain is likely due to compensation.  The treatment here is to get her left knee better.  It is already improving in the meantime.  Okay to defer imaging now  Keep taking diclofenac with food.   This should gradually get better.  If not then she can let me know.  Would keep using the crutch and icing and bracing.

## 2021-05-29 NOTE — Assessment & Plan Note (Signed)
Without sign of failure. Keep taking diclofenac with food.   This should gradually get better.   Start using a heel lift in both shoes and ice your achilles as needed.  Don't go barefoot.  Update me as needed.

## 2021-06-06 ENCOUNTER — Ambulatory Visit (INDEPENDENT_AMBULATORY_CARE_PROVIDER_SITE_OTHER): Payer: BC Managed Care – PPO | Admitting: Family Medicine

## 2021-06-06 ENCOUNTER — Encounter: Payer: Self-pay | Admitting: Family Medicine

## 2021-06-06 ENCOUNTER — Other Ambulatory Visit: Payer: Self-pay

## 2021-06-06 VITALS — BP 144/92 | HR 85 | Temp 98.1°F | Ht 68.0 in | Wt 190.0 lb

## 2021-06-06 DIAGNOSIS — G43909 Migraine, unspecified, not intractable, without status migrainosus: Secondary | ICD-10-CM

## 2021-06-06 DIAGNOSIS — E785 Hyperlipidemia, unspecified: Secondary | ICD-10-CM

## 2021-06-06 DIAGNOSIS — Z1211 Encounter for screening for malignant neoplasm of colon: Secondary | ICD-10-CM

## 2021-06-06 DIAGNOSIS — K760 Fatty (change of) liver, not elsewhere classified: Secondary | ICD-10-CM

## 2021-06-06 DIAGNOSIS — I10 Essential (primary) hypertension: Secondary | ICD-10-CM

## 2021-06-06 DIAGNOSIS — M2550 Pain in unspecified joint: Secondary | ICD-10-CM

## 2021-06-06 DIAGNOSIS — Z7189 Other specified counseling: Secondary | ICD-10-CM

## 2021-06-06 DIAGNOSIS — Z0000 Encounter for general adult medical examination without abnormal findings: Secondary | ICD-10-CM

## 2021-06-06 MED ORDER — LOSARTAN POTASSIUM 50 MG PO TABS: 50.0000 mg | ORAL_TABLET | Freq: Every day | ORAL | 3 refills | Status: DC

## 2021-06-06 MED ORDER — SUMATRIPTAN SUCCINATE 50 MG PO TABS: ORAL_TABLET | ORAL | 5 refills | Status: DC

## 2021-06-06 MED ORDER — AMLODIPINE BESYLATE 5 MG PO TABS: 5.0000 mg | ORAL_TABLET | Freq: Every day | ORAL | 3 refills | Status: DC

## 2021-06-06 MED ORDER — PREDNISONE 10 MG PO TABS: ORAL_TABLET | ORAL | 0 refills | Status: DC

## 2021-06-06 NOTE — Progress Notes (Signed)
This visit occurred during the SARS-CoV-2 public health emergency.  Safety protocols were in place, including screening questions prior to the visit, additional usage of staff PPE, and extensive cleaning of exam room while observing appropriate contact time as indicated for disinfecting solutions.  CPE- See plan.  Routine anticipatory guidance given to patient.  See health maintenance.  The possibility exists that previously documented standard health maintenance information may have been brought forward from a previous encounter into this note.  If needed, that same information has been updated to reflect the current situation based on today's encounter.    Tetanus 2017 Flu due this fall.   PNA due at 67 shingrix d/w pt. covid 2021 Mammogram encouraged.  see avs.   DXA due at 67 Colonoscopy d/w pt.  Referral placed.   Pap d/w pt. Per GYN, d/w pt.  Done ~2016 per patient report. Reasonable for 5 year f/u, per [facility]. Husband designated if patient were incapacitated.   Migraines.  overall rare use of imitrex.  With relief when used.  No adverse effect on medication.  Episodic use.     Hypertension:               Using medication without problems or lightheadedness: yes Chest pain with exertion:no Edema:no Short of breath:no Her BP is normally controlled at home.     Elevated Cholesterol: Statin start deferred given the ongoing joint pain.  D/w pt.     Fatty liver hx d/w pt.  mild LFT elevation d/w pt.  Discussed with patient re: diet and exercise.     Knee pain/achilles pain. R hip bursitis pain worse yesterday and she was compensating with L leg/knee.  Still walking with a cane in the meantime.  Achilles pain better with heel lifts. She has an appointment pending with ortho.  R hip pain is better today after taking it east today.  Still on diclofenac.    PMH and SH reviewed  Meds, vitals, and allergies reviewed.   ROS: Per HPI.  Unless specifically indicated otherwise in  HPI, the patient denies:  General: fever. Eyes: acute vision changes ENT: sore throat Cardiovascular: chest pain Respiratory: SOB GI: vomiting GU: dysuria Musculoskeletal: acute back pain Derm: acute rash Neuro: acute motor dysfunction Psych: worsening mood Endocrine: polydipsia Heme: bleeding Allergy: hayfever  GEN: nad, alert and oriented HEENT: ncat NECK: supple w/o LA CV: rrr. PULM: ctab, no inc wob ABD: soft, +bs EXT: no edema SKIN: no acute rash

## 2021-06-06 NOTE — Patient Instructions (Addendum)
You can call for a mammogram at [facility].  [place]  Call about seeing gynecology.    We'll call about seeing GI.  I'll await the ortho notes. If you don't get better with icing and rest, then stop diclofenac and take prednisone with food.    I would get a flu shot each fall.    Take care.  Glad to see you.

## 2021-06-08 DIAGNOSIS — M2550 Pain in unspecified joint: Secondary | ICD-10-CM | POA: Insufficient documentation

## 2021-06-08 NOTE — Assessment & Plan Note (Signed)
  Statin start deferred given the ongoing joint pain.  D/w pt.

## 2021-06-08 NOTE — Assessment & Plan Note (Signed)
Husband designated if patient were incapacitated. 

## 2021-06-08 NOTE — Assessment & Plan Note (Signed)
overall rare use of imitrex.  With relief when used.  No adverse effect on medication.  Episodic use.   Continue prn use.

## 2021-06-08 NOTE — Assessment & Plan Note (Signed)
She is going to follow-up with orthopedics.  If not better with icing and rest, then stop diclofenac and take prednisone with food.  Routine steroid cautions discussed with patient.

## 2021-06-08 NOTE — Assessment & Plan Note (Signed)
Tetanus 2017 Flu due this fall.   PNA due at 67 shingrix d/w pt. covid 2021 Mammogram encouraged.  see avs.   DXA due at 67 Colonoscopy d/w pt.  Referral placed.   Pap d/w pt. Per GYN, d/w pt.  Done ~2016 per patient report. Reasonable for 5 year f/u, per [facility] OB[facility]. Husband designated if patient were incapacitated.

## 2021-06-08 NOTE — Assessment & Plan Note (Signed)
Fatty liver hx d/w pt.  mild LFT elevation d/w pt.  Discussed with patient re: diet and exercise.

## 2021-06-08 NOTE — Assessment & Plan Note (Signed)
Continue amlodipine.  Continue losartan.

## 2021-06-26 ENCOUNTER — Other Ambulatory Visit (HOSPITAL_COMMUNITY): Payer: Self-pay | Admitting: Orthopedic Surgery

## 2021-06-26 DIAGNOSIS — M25562 Pain in left knee: Secondary | ICD-10-CM

## 2021-06-26 DIAGNOSIS — M7061 Trochanteric bursitis, right hip: Secondary | ICD-10-CM | POA: Diagnosis not present

## 2021-07-08 ENCOUNTER — Ambulatory Visit: Admission: RE | Admit: 2021-07-08 | Payer: BC Managed Care – PPO | Source: Ambulatory Visit

## 2021-07-15 DIAGNOSIS — S83412A Sprain of medial collateral ligament of left knee, initial encounter: Secondary | ICD-10-CM | POA: Diagnosis not present

## 2021-07-15 DIAGNOSIS — M1712 Unilateral primary osteoarthritis, left knee: Secondary | ICD-10-CM | POA: Diagnosis not present

## 2021-07-15 DIAGNOSIS — S83242A Other tear of medial meniscus, current injury, left knee, initial encounter: Secondary | ICD-10-CM | POA: Diagnosis not present

## 2021-07-15 DIAGNOSIS — S83282A Other tear of lateral meniscus, current injury, left knee, initial encounter: Secondary | ICD-10-CM | POA: Diagnosis not present

## 2021-07-24 ENCOUNTER — Telehealth: Payer: Self-pay

## 2021-07-24 DIAGNOSIS — M25561 Pain in right knee: Secondary | ICD-10-CM | POA: Diagnosis not present

## 2021-07-24 DIAGNOSIS — M25562 Pain in left knee: Secondary | ICD-10-CM | POA: Diagnosis not present

## 2021-07-24 NOTE — Telephone Encounter (Signed)
Thank you. Will see her in office in the morning

## 2021-07-24 NOTE — Telephone Encounter (Signed)
I spoke with pt; Pt said that she has crampy pain in both legs on and off and when has cramps pain level is 3. No CP or SOB and no redness in legs but swelling has started but pt has torn menicus and that causes swelling sometimes. Pt does not want to go to UC today and pt scheduled appt with Romilda Garret NP on 07/25/21 at 8 AM. No covid symptoms and no known covid exposure. UC & ED precautions given and pt voiced understanding. Sending note to Dr Damita Dunnings as PCP, Romilda Garret NP and Azalee Course CMA and will teams Anastasiya.

## 2021-07-24 NOTE — Telephone Encounter (Signed)
[place] [facility] - Client TELEPHONE ADVICE RECORD [facility] Patient Name: [facility] MEN Gender: Female DOB: 10-20-1955 Age: 65 Y 3 M 12 D Return Phone Number: [phone] (Primary) Address: City/ State/ Zip: [place]  [address] Client [place] [facility] - Client Client Site [place] - Day Provider Renford Dills - MD Contact Type Call Who Is Calling Patient / Member / Family / Caregiver Call Type Triage / Clinical Relationship To Patient Self Return Phone Number [phone] (Primary) Chief Complaint Abdominal Pain Reason for Call Symptomatic / Request for [place] states she is having bad cramps in her feet and legs. She can't sleep at night. Sometimes she gets it in her stomach and shoulder blade. She is wondering if this is happening due to her BP medication. Translation No Nurse Assessment Nurse: Della Goo, RN, Vicente Males Date/Time (Eastern Time): 07/24/2021 4:04:00 PM Confirm and document reason for call. If symptomatic, describe symptoms. ---Caller states she is having cramping, mostly in the legs and feet, multiple times a day and night. Wakes her up multiple times a night, is able to get it to go away by walking or stretching. Occasionally has cramping in the abdomen and shoulder as well. Started 2-3 weeks ago but it has gotten worse. Has had this issue in the past when she was taking lisinopril, is currently taking losartan. Also having swelling to the L ankle. Does the patient have any new or worsening symptoms? ---Yes Will a triage be completed? ---Yes Related visit to physician within the last 2 weeks? ---Yes Does the PT have any chronic conditions? (i.e. diabetes, asthma, this includes High risk factors for pregnancy, etc.) ---Yes List chronic conditions. ---htn Is this a behavioral health or substance abuse call? ---No PLEASE NOTE: All timestamps  contained within this report are represented as [place] Standard Time. CONFIDENTIALTY NOTICE: This fax transmission is intended only for the addressee. It contains information that is legally privileged, confidential or otherwise protected from use or disclosure. If you are not the intended recipient, you are strictly prohibited from reviewing, disclosing, copying using or disseminating any of this information or taking any action in reliance on or regarding this information. If you have received this fax in error, please notify us immediately by telephone so that we can arrange for its return to [place]. Phone: [phone], Toll-Free: [phone], Fax: [phone] Page: 2 of 2 Call Id: [id number] Guidelines Guideline Title Affirmed Question Affirmed Notes Nurse Date/Time Eilene Ghazi Time) Leg Pain [1] Thigh, calf, or ankle swelling AND [2] only 1 side Della Goo, RN, Vicente Males 07/24/2021 4:08:36 PM Disp. Time Eilene Ghazi Time) Disposition Final User 07/24/2021 4:13:33 PM See HCP within 4 Hours (or PCP triage) Yes Quandt, RN, Allegra Lai Disagree/Comply Disagree Caller Understands Yes PreDisposition Did not know what to do Care Advice Given Per Guideline SEE HCP (OR PCP TRIAGE) WITHIN 4 HOURS: * IF OFFICE WILL BE OPEN: You need to be seen within the next 3 or 4 hours. Call your doctor (or NP/PA) now or as soon as the office opens. CALL BACK IF: * You become worse CARE ADVICE given per Leg Pain (Adult) guideline. * UCC: Some UCCs can manage patients who are stable and have less serious symptoms (e.g., minor illnesses and injuries). The triager must know the [facility] capabilities before sending a patient there. If unsure, call ahead. * You develop any chest pain or shortness of breath. CALL EMS IF: Comments User: Juanda Crumble, RN Date/Time Eilene Ghazi Time):  07/24/2021 4:16:53 PM attempted to reach backline, rang busy User: Juanda Crumble, RN Date/Time Eilene Ghazi Time): 07/24/2021 4:17:32 PM caller declined uc, states  she would like an appt in the office tomorrow Referrals [place] REFUSED REFERRED TO PCP OFFIC

## 2021-07-25 ENCOUNTER — Other Ambulatory Visit: Payer: Self-pay

## 2021-07-25 ENCOUNTER — Other Ambulatory Visit: Payer: Self-pay | Admitting: Nurse Practitioner

## 2021-07-25 ENCOUNTER — Ambulatory Visit: Payer: BC Managed Care – PPO | Admitting: Nurse Practitioner

## 2021-07-25 VITALS — BP 150/108 | HR 98 | Temp 98.0°F | Resp 16 | Ht 68.0 in | Wt 193.1 lb

## 2021-07-25 DIAGNOSIS — D72829 Elevated white blood cell count, unspecified: Secondary | ICD-10-CM

## 2021-07-25 DIAGNOSIS — R252 Cramp and spasm: Secondary | ICD-10-CM | POA: Diagnosis not present

## 2021-07-25 DIAGNOSIS — I10 Essential (primary) hypertension: Secondary | ICD-10-CM | POA: Diagnosis not present

## 2021-07-25 LAB — CBC
HCT: 40.5 % (ref 36.0–46.0)
Hemoglobin: 13.4 g/dL (ref 12.0–15.0)
MCHC: 33.2 g/dL (ref 30.0–36.0)
MCV: 89.9 fl (ref 78.0–100.0)
Platelets: 332 10*3/uL (ref 150.0–400.0)
RBC: 4.5 Mil/uL (ref 3.87–5.11)
RDW: 14.3 % (ref 11.5–15.5)
WBC: 14.1 10*3/uL — ABNORMAL HIGH (ref 4.0–10.5)

## 2021-07-25 LAB — COMPREHENSIVE METABOLIC PANEL
ALT: 42 U/L — ABNORMAL HIGH (ref 0–35)
AST: 20 U/L (ref 0–37)
Albumin: 4.6 g/dL (ref 3.5–5.2)
Alkaline Phosphatase: 81 U/L (ref 39–117)
BUN: 22 mg/dL (ref 6–23)
CO2: 24 mEq/L (ref 19–32)
Calcium: 10.3 mg/dL (ref 8.4–10.5)
Chloride: 103 mEq/L (ref 96–112)
Creatinine, Ser: 0.76 mg/dL (ref 0.40–1.20)
GFR: 83.47 mL/min (ref >60.00)
Glucose, Bld: 135 mg/dL — ABNORMAL HIGH (ref 70–99)
Potassium: 4 mEq/L (ref 3.5–5.1)
Sodium: 138 mEq/L (ref 135–145)
Total Bilirubin: 1 mg/dL (ref 0.2–1.2)
Total Protein: 7.8 g/dL (ref 6.0–8.3)

## 2021-07-25 LAB — MAGNESIUM: Magnesium: 2.3 mg/dL (ref 1.5–2.5)

## 2021-07-25 MED ORDER — METHOCARBAMOL 500 MG PO TABS: 500.0000 mg | ORAL_TABLET | Freq: Two times a day (BID) | ORAL | 0 refills | Status: AC | PRN

## 2021-07-25 NOTE — Patient Instructions (Signed)
Nice to see you today Will be in touch regarding your labs Sent in a muscle relaxer to try. Take it at home and make sure it does not make you sleepy at first Check your BP at home 3-4 times this week and make sure that it is back to normal for you

## 2021-07-25 NOTE — Assessment & Plan Note (Signed)
Patient experiencing cramps does have long history of severe hypokalemia and avoids diuretics for this cause.  We will check electrolytes along with CBC.  Patient has good color pulses movement and foot.  We will try a muscle relaxer to help her get some sleep sedation precautions reviewed with patient.  Continue to monitor pending lab results

## 2021-07-25 NOTE — Progress Notes (Signed)
Acute Office Visit  Subjective:    Patient ID: Yolanda Delgado, female    DOB: 09-Nov-1955, 65 y.o.   MRN: [id number]  Chief Complaint  Patient presents with   leg cramping    Both legs, started around 3 to 4 weeks ago. More intense now. Daily off and on the past week during the day and at night time-wakes patient up.    HPI Patient is in today for Leg cramps  States that the past week the cramping has been worse. States that no inciting events but can happen when she is pushing pressure down on her foot.  Has been taking "brain supplements" Coffee berry. Has tried before with out any adverse effect. Also can happen with stretching. Happening through the day and at night.  Hypertension: Does have the ability to check her blood pressure at home. Does not do it regularly. Past couple of doctors appointments it has been WNL. She takes losartan in the AM and amlodipine in the PM. Does have a headache but has history of migraines.   Past Medical History:  Diagnosis Date   Colon polyp    Family history of B12 deficiency    Fatty liver    Hyperlipidemia    Hypertension    Hypokalemia    with diuretic use   Migraine    Ocular rosacea    Palpitation    due to caffeine    Past Surgical History:  Procedure Laterality Date   GALLBLADDER SURGERY     TONSILLECTOMY AND ADENOIDECTOMY     UTERINE ABLATION     WISDOM TOOTH EXTRACTION      Family History  Problem Relation Age of Onset   Renal cancer Mother    Breast cancer Mother    Cancer Mother    Stroke Father    Colon cancer Neg Hx     Social History   Socioeconomic History   Marital status: Married    Spouse name: Not on file   Number of children: Not on file   Years of education: Not on file   Highest education level: Not on file  Occupational History   Not on file  Tobacco Use   Smoking status: Never   Smokeless tobacco: Never  Substance and Sexual Activity   Alcohol use: No   Drug use: No   Sexual activity:  Yes    Partners: Male    Birth control/protection: None  Other Topics Concern   Not on file  Social History Narrative   Married 1978   2 kids, son and daughter both in [place]   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 90 tablet 3   diclofenac (VOLTAREN) 75 MG EC tablet Take 1 tablet (75 mg total) by mouth 2 (two) times daily. With food. 180 tablet 3   famotidine (  PEPCID) 20 MG tablet Take 20 mg by mouth 2 (two) times daily.     Ginger, Zingiber officinalis, (GINGER ROOT) 250 MG CAPS Takes 1 capsules daily     losartan (COZAAR) 50 MG tablet Take 1 tablet (50 mg total) by mouth daily. 90 tablet 3   Nutritional Supplements (ESTROVEN PO) Take by mouth daily.     SUMAtriptan (IMITREX) 50 MG tablet May repeat in 2 hours if headache persists or recurs. 10 tablet 5   TURMERIC PO Take by mouth daily.     NON FORMULARY [place] Oil daily.     predniSONE (DELTASONE) 10 MG tablet Take 2 a day for 5 days, then 1 a day for 5 days, with food. Don't take with aleve/ibuprofen/diclofenac. 15 tablet 0   No facility-administered medications prior to visit.    Allergies  Allergen Reactions   Ace Inhibitors Other (See Comments)    H/o leg cramping with ACE use.    Hydrochlorothiazide Other (See Comments)    Risk of hypokalemia- prev hospitalized.     Lasix [Furosemide] Other (See Comments)    Risk of  hypokalemia- prev hospitalized.   Pamabrom Other (See Comments)    Low potassium   Zyrtec [Cetirizine] Other (See Comments)    Worsening dry eyes    Review of Systems  Constitutional:  Negative for chills and fever.  Respiratory:  Negative for cough and shortness of breath.   Cardiovascular:  Positive for leg swelling. Negative for chest pain.  Gastrointestinal:  Negative for nausea and vomiting.  Musculoskeletal:  Positive for myalgias (cramping).  Neurological:  Positive for headaches. Negative for weakness.      Objective:    Physical Exam Vitals and nursing note reviewed.  Constitutional:      Appearance: Normal appearance.  Cardiovascular:     Rate and Rhythm: Normal rate and regular rhythm.     Pulses:          Dorsalis pedis pulses are 2+ on the right side and 2+ on the left side.       Posterior tibial pulses are 1+ on the right side and 1+ on the left side.     Comments: Does have torn meniscus on left side being treated for Pulmonary:     Effort: Pulmonary effort is normal.     Breath sounds: Normal breath sounds.  Abdominal:     General: Bowel sounds are normal.  Musculoskeletal:     Right lower leg: 1+ Edema present.     Left lower leg: 2+ Edema present.  Neurological:     Mental Status: She is alert.     Motor: No weakness.     Gait: Gait normal.     Deep Tendon Reflexes: Reflexes normal.     Reflex Scores:      Patellar reflexes are 3+ on the right side and 3+ on the left side. Psychiatric:        Mood and Affect: Mood normal.        Behavior: Behavior normal.        Thought Content: Thought content normal.        Judgment: Judgment normal.    BP (!) 150/108   Pulse 98   Temp 98 F (36.7 C)   Resp 16   Ht 5\' 8"  (1.727 m)   Wt 193 lb 2 oz (87.6 kg)   SpO2 99%   BMI 29.36 kg/m  Wt Readings from Last 3 Encounters:  07/25/21 193 lb 2 oz (  87.6 kg)  06/06/21 190 lb (86.2 kg)  05/27/21 192 lb (87.1 kg)    Health Maintenance Due  Topic Date Due    PAP SMEAR-Modifier  Never done   MAMMOGRAM  Never done   Zoster Vaccines- Shingrix (1 of 2) Never done   COVID-19 Vaccine (2 - Booster for Janssen series) 03/07/2020    There are no preventive care reminders to display for this patient.   Lab Results  Component Value Date   TSH 3.03 09/17/2017   Lab Results  Component Value Date   WBC 7.5 09/17/2017   HGB 12.9 09/17/2017   HCT 37.0 09/17/2017   MCV 86.4 09/17/2017   PLT 303 09/17/2017   Lab Results  Component Value Date   NA 141 05/27/2021   K 3.9 05/27/2021   CO2 27 05/27/2021   GLUCOSE 89 05/27/2021   BUN 18 05/27/2021   CREATININE 0.75 05/27/2021   BILITOT 1.2 05/27/2021   ALKPHOS 73 05/27/2021   AST 21 05/27/2021   ALT 37 (H) 05/27/2021   PROT 6.6 05/27/2021   ALBUMIN 4.3 05/27/2021   CALCIUM 9.6 05/27/2021   ANIONGAP 6 (L) 12/31/2011   GFR 84.90 05/27/2021   Lab Results  Component Value Date   CHOL 237 (H) 05/27/2021   Lab Results  Component Value Date   HDL 57.60 05/27/2021   Lab Results  Component Value Date   LDLCALC 152 (H) 05/27/2021   Lab Results  Component Value Date   TRIG 138.0 05/27/2021   Lab Results  Component Value Date   CHOLHDL 4 05/27/2021   No results found for: HGBA1C     Assessment & Plan:   Problem List Items Addressed This Visit       Cardiovascular and Mediastinum   HTN (hypertension)    Patient currently maintained on amlodipine and losartan.  Blood pressure elevated in office today.  Patient thinks this is due to to lack of sleep and running around this morning.  She does have debility check blood pressure at home she instructed to do so and report readings back to [place] if they are abnormal.  Continue to monitor. Continue amlodipine and losartan.        Other   Cramps of lower extremity - Primary    Patient experiencing cramps does have long history of severe hypokalemia and avoids diuretics for this cause.  We will check electrolytes along with CBC.  Patient  has good color pulses movement and foot.  We will try a muscle relaxer to help her get some sleep sedation precautions reviewed with patient.  Continue to monitor pending lab results      Relevant Medications   methocarbamol (ROBAXIN) 500 MG tablet   Other Relevant Orders   CBC   Comprehensive metabolic panel   Magnesium     No orders of the defined types were placed in this encounter.  This visit occurred during the SARS-CoV-2 public health emergency.  Safety protocols were in place, including screening questions prior to the visit, additional usage of staff PPE, and extensive cleaning of exam room while observing appropriate contact time as indicated for disinfecting solutions.   Romilda Garret, NP

## 2021-07-25 NOTE — Assessment & Plan Note (Signed)
Patient currently maintained on amlodipine and losartan.  Blood pressure elevated in office today.  Patient thinks this is due to to lack of sleep and running around this morning.  She does have debility check blood pressure at home she instructed to do so and report readings back to [place] if they are abnormal.  Continue to monitor. Continue amlodipine and losartan.

## 2021-08-13 ENCOUNTER — Telehealth: Payer: Self-pay | Admitting: Family Medicine

## 2021-08-13 NOTE — Telephone Encounter (Signed)
Methocarbamol is not on current medication list.  Please advise.

## 2021-08-13 NOTE — Telephone Encounter (Signed)
Pt saw Matt on 12/2 she is requesting a refill on methocarbamol (ROBAXIN) 500 MG tablet  Because the muscle pain is still going on

## 2021-08-13 NOTE — Telephone Encounter (Signed)
I wrote her a 7 day supply at the beginning of the month. Did it help? Has she still been cramping this whole time?

## 2021-08-14 ENCOUNTER — Other Ambulatory Visit: Payer: Self-pay | Admitting: Nurse Practitioner

## 2021-08-14 MED ORDER — METHOCARBAMOL 500 MG PO TABS: 500.0000 mg | ORAL_TABLET | Freq: Two times a day (BID) | ORAL | 0 refills | Status: DC | PRN

## 2021-08-14 NOTE — Telephone Encounter (Signed)
10 pills sent in she needs to schedule a follow up with her PCP (Dr. Damita Dunnings) for further meds and further evaluation

## 2021-08-14 NOTE — Telephone Encounter (Signed)
Spoke with patient. Patient states she took the 7 day supply. It did help, some days does not have them daily like she was. Some nights will have it 3 times. Patient states she does have follow up with Dr Damita Dunnings on 08/28/21. She knows that this medication is probably is like a "bandaid," and will follow up with him on further plan for this. Overall would say she is about 70% better. She wanted to have some of this medication on hand for the holidays just in case.

## 2021-08-15 NOTE — Telephone Encounter (Signed)
Pt notified Rx sent and she will keep f/u with PCP to address issues

## 2021-08-28 ENCOUNTER — Ambulatory Visit: Payer: BC Managed Care – PPO | Admitting: Family Medicine

## 2021-12-05 DIAGNOSIS — Z86010 Personal history of colonic polyps: Secondary | ICD-10-CM | POA: Diagnosis not present

## 2022-01-05 ENCOUNTER — Telehealth: Payer: Self-pay

## 2022-01-05 NOTE — Telephone Encounter (Signed)
Received a note from [facility] in regards to expired order for CBC with comments below ?"This order expired it seems. Not sure if she wants to get the white cell count rechecked." ? ?Last check was in December 2022 and level was elevated some.  ? ?Called patient but was not able to leave a message. Will try again later ?

## 2022-01-07 NOTE — Telephone Encounter (Signed)
Spoke with patient. Patient states she thinks the elevation may have been from taking steroid around that time and would like to wait on rechecking this until her CPE on 06/08/22 with Dr Damita Dunnings ?

## 2022-03-10 DIAGNOSIS — D126 Benign neoplasm of colon, unspecified: Secondary | ICD-10-CM | POA: Diagnosis not present

## 2022-03-10 DIAGNOSIS — K5730 Diverticulosis of large intestine without perforation or abscess without bleeding: Secondary | ICD-10-CM | POA: Diagnosis not present

## 2022-03-10 DIAGNOSIS — D122 Benign neoplasm of ascending colon: Secondary | ICD-10-CM | POA: Diagnosis not present

## 2022-03-10 DIAGNOSIS — D125 Benign neoplasm of sigmoid colon: Secondary | ICD-10-CM | POA: Diagnosis not present

## 2022-03-10 DIAGNOSIS — D120 Benign neoplasm of cecum: Secondary | ICD-10-CM | POA: Diagnosis not present

## 2022-03-10 DIAGNOSIS — D123 Benign neoplasm of transverse colon: Secondary | ICD-10-CM | POA: Diagnosis not present

## 2022-03-10 DIAGNOSIS — Z86010 Personal history of colonic polyps: Secondary | ICD-10-CM | POA: Diagnosis not present

## 2022-03-10 LAB — HM COLONOSCOPY

## 2022-04-21 DIAGNOSIS — M1712 Unilateral primary osteoarthritis, left knee: Secondary | ICD-10-CM | POA: Diagnosis not present

## 2022-04-22 DIAGNOSIS — M1712 Unilateral primary osteoarthritis, left knee: Secondary | ICD-10-CM | POA: Diagnosis not present

## 2022-05-08 ENCOUNTER — Encounter: Payer: Self-pay | Admitting: Family Medicine

## 2022-05-08 ENCOUNTER — Ambulatory Visit: Payer: BC Managed Care – PPO | Admitting: Family Medicine

## 2022-05-08 VITALS — BP 140/90 | HR 69 | Temp 98.6°F | Ht 68.0 in | Wt 180.1 lb

## 2022-05-08 DIAGNOSIS — R197 Diarrhea, unspecified: Secondary | ICD-10-CM | POA: Diagnosis not present

## 2022-05-08 DIAGNOSIS — R1032 Left lower quadrant pain: Secondary | ICD-10-CM | POA: Insufficient documentation

## 2022-05-08 LAB — CBC WITH DIFFERENTIAL/PLATELET
Basophils Absolute: 0 10*3/uL (ref 0.0–0.1)
Basophils Relative: 0.6 % (ref 0.0–3.0)
Eosinophils Absolute: 0.1 10*3/uL (ref 0.0–0.7)
Eosinophils Relative: 2.2 % (ref 0.0–5.0)
HCT: 40.2 % (ref 36.0–46.0)
Hemoglobin: 13.4 g/dL (ref 12.0–15.0)
Lymphocytes Relative: 43.1 % (ref 12.0–46.0)
Lymphs Abs: 2.7 10*3/uL (ref 0.7–4.0)
MCHC: 33.4 g/dL (ref 30.0–36.0)
MCV: 89.6 fl (ref 78.0–100.0)
Monocytes Absolute: 0.4 10*3/uL (ref 0.1–1.0)
Monocytes Relative: 6.2 % (ref 3.0–12.0)
Neutro Abs: 3 10*3/uL (ref 1.4–7.7)
Neutrophils Relative %: 47.9 % (ref 43.0–77.0)
Platelets: 247 10*3/uL (ref 150.0–400.0)
RBC: 4.48 Mil/uL (ref 3.87–5.11)
RDW: 13.5 % (ref 11.5–15.5)
WBC: 6.2 10*3/uL (ref 4.0–10.5)

## 2022-05-08 LAB — COMPREHENSIVE METABOLIC PANEL
ALT: 29 U/L (ref 0–35)
AST: 21 U/L (ref 0–37)
Albumin: 4.4 g/dL (ref 3.5–5.2)
Alkaline Phosphatase: 59 U/L (ref 39–117)
BUN: 15 mg/dL (ref 6–23)
CO2: 30 mEq/L (ref 19–32)
Calcium: 10.2 mg/dL (ref 8.4–10.5)
Chloride: 104 mEq/L (ref 96–112)
Creatinine, Ser: 0.77 mg/dL (ref 0.40–1.20)
GFR: 81.72 mL/min (ref >60.00)
Glucose, Bld: 82 mg/dL (ref 70–99)
Potassium: 4.2 mEq/L (ref 3.5–5.1)
Sodium: 141 mEq/L (ref 135–145)
Total Bilirubin: 1.9 mg/dL — ABNORMAL HIGH (ref 0.2–1.2)
Total Protein: 7 g/dL (ref 6.0–8.3)

## 2022-05-08 MED ORDER — HYDROCORT-PRAMOXINE (PERIANAL) 2.5-1 % EX CREA: TOPICAL_CREAM | Freq: Three times a day (TID) | CUTANEOUS | 0 refills | Status: DC

## 2022-05-08 NOTE — Assessment & Plan Note (Signed)
Acute, differential includes acute diverticulitis, mild versus viral/bacterial gastroenteritis versus functional diarrhea. Not consistent with appendicitis or cholecystitis.  We discussed consideration of labs and imaging and possible treatment.  We will begin with complete blood metabolic panel and CBC to make sure electrolytes and renal function remain normal as well as no clear sign of infection.  If white blood cells are not elevated given pain is mild she will continue clear liquids push fluids and follow-up next week if not improving.  If she has severe symptoms we reviewed ER precautions.  She will call next week if symptoms continued for possible stool pathogen testing versus abdominal CT

## 2022-05-08 NOTE — Patient Instructions (Addendum)
Clear liquids diet... gatorade, broth  Push fluids.  Advance diet as tolerated.  Please stop at the lab to have labs drawn.  Call next week if  continued symptoms for possible stool testing and CT abd pelvis. Use topical treatment for hemorrhoids.

## 2022-05-08 NOTE — Progress Notes (Signed)
Patient ID: Yolanda Delgado, female    DOB: [birth year], 66 y.o.   MRN: [id number]  This visit was conducted in person.  BP (!) 140/90   Pulse 69   Temp 98.6 F (37 C) (Oral)   Ht '5\' 8"'$  (1.727 m)   Wt 180 lb 2 oz (81.7 kg)   SpO2 98%   BMI 27.39 kg/m    CC:  Chief Complaint  Patient presents with   Nausea   Diarrhea    Last week   LLQ Pain   Fatigue         Subjective:   HPI: Yolanda Delgado is a 66 y.o. female patient of Dr. Josefine Class presenting on 05/08/2022 for Nausea, Diarrhea (Last week), LLQ Pain, and Fatigue (/)  She reports new onset nausea, diarrhea in the last  10 days. ( No exposure) Seemed to improve some. Still occ 1-2 episodes of diarrhea 3 days ago. Started with pain in LLQ after eating.   She tried liquid diet for 2 days, now on bland diet.  She is eating lots of popcorn. She also notes fatigue ongoing. Loose stools have irritated her hemorrhoids.   Pain is mild, not severe.  Small brbpr from hemorrhoids.  No fever. No vomiting.  She is keeping up with  fluids.   Past colonoscopy July 2023:reviewed pathology multiple polyps Cannot find note of tics or not.... pt reports tic were noted in the past.  No sick contacts. Relevant past medical, surgical, family and social history reviewed and updated as indicated. Interim medical history since our last visit reviewed. Allergies and medications reviewed and updated. Outpatient Medications Prior to Visit  Medication Sig Dispense Refill   albuterol (VENTOLIN HFA) 108 (90 Base) MCG/ACT inhaler TAKE 2 PUFFS BY MOUTH EVERY 6 HOURS AS NEEDED FOR WHEEZE OR SHORTNESS OF BREATH 6.7 each 0   amLODipine (NORVASC) 5 MG tablet Take 1 tablet (5 mg total) by mouth daily. 90 tablet 3   diclofenac (VOLTAREN) 75 MG EC tablet Take 1 tablet (75 mg total) by mouth 2 (two) times daily. With food. 180 tablet 3   famotidine-calcium carbonate-magnesium hydroxide (PEPCID COMPLETE) 10-800-165 MG chewable tablet Chew 1 tablet by mouth  daily as needed.     Ginger, Zingiber officinalis, (GINGER ROOT) 250 MG CAPS Takes 1 capsules daily     losartan (COZAAR) 50 MG tablet Take 1 tablet (50 mg total) by mouth daily. 90 tablet 3   methocarbamol (ROBAXIN) 500 MG tablet Take 1 tablet (500 mg total) by mouth 2 (two) times daily as needed for muscle spasms. 10 tablet 0   Nutritional Supplements (ESTROVEN PO) Take by mouth daily.     ranitidine (ZANTAC) 75 MG tablet Take 75 mg by mouth at bedtime.     SUMAtriptan (IMITREX) 50 MG tablet May repeat in 2 hours if headache persists or recurs. 10 tablet 5   TURMERIC PO Take by mouth daily.     famotidine (PEPCID) 20 MG tablet Take 20 mg by mouth 2 (two) times daily.     No facility-administered medications prior to visit.     Per HPI unless specifically indicated in ROS section below Review of Systems  Constitutional:  Negative for fatigue and fever.  HENT:  Negative for congestion.   Eyes:  Negative for pain.  Respiratory:  Negative for cough and shortness of breath.   Cardiovascular:  Negative for chest pain, palpitations and leg swelling.  Gastrointestinal:  Positive for abdominal pain, anal bleeding, diarrhea and nausea.  Negative for abdominal distention and constipation.  Genitourinary:  Negative for dysuria and vaginal bleeding.  Musculoskeletal:  Negative for back pain.  Neurological:  Negative for syncope, light-headedness and headaches.  Psychiatric/Behavioral:  Negative for dysphoric mood.    Objective:  BP (!) 140/90   Pulse 69   Temp 98.6 F (37 C) (Oral)   Ht '5\' 8"'$  (1.727 m)   Wt 180 lb 2 oz (81.7 kg)   SpO2 98%   BMI 27.39 kg/m   Wt Readings from Last 3 Encounters:  05/08/22 180 lb 2 oz (81.7 kg)  07/25/21 193 lb 2 oz (87.6 kg)  06/06/21 190 lb (86.2 kg)      Physical Exam Constitutional:      General: She is not in acute distress.    Appearance: Normal appearance. She is well-developed. She is not ill-appearing or toxic-appearing.  HENT:     Head:  Normocephalic.     Right Ear: Hearing, tympanic membrane, ear canal and external ear normal. Tympanic membrane is not erythematous, retracted or bulging.     Left Ear: Hearing, tympanic membrane, ear canal and external ear normal. Tympanic membrane is not erythematous, retracted or bulging.     Nose: No mucosal edema or rhinorrhea.     Right Sinus: No maxillary sinus tenderness or frontal sinus tenderness.     Left Sinus: No maxillary sinus tenderness or frontal sinus tenderness.     Mouth/Throat:     Pharynx: Uvula midline.  Eyes:     General: Lids are normal. Lids are everted, no foreign bodies appreciated.     Conjunctiva/sclera: Conjunctivae normal.     Pupils: Pupils are equal, round, and reactive to light.  Neck:     Thyroid: No thyroid mass or thyromegaly.     Vascular: No carotid bruit.     Trachea: Trachea normal.  Cardiovascular:     Rate and Rhythm: Normal rate and regular rhythm.     Pulses: Normal pulses.     Heart sounds: Normal heart sounds, S1 normal and S2 normal. No murmur heard.    No friction rub. No gallop.  Pulmonary:     Effort: Pulmonary effort is normal. No tachypnea or respiratory distress.     Breath sounds: Normal breath sounds. No decreased breath sounds, wheezing, rhonchi or rales.  Abdominal:     General: Bowel sounds are normal.     Palpations: Abdomen is soft.     Tenderness: There is generalized abdominal tenderness. There is no right CVA tenderness, left CVA tenderness, guarding or rebound.     Comments: Mild diffuse ttp, slightly worse in LLQ  Musculoskeletal:     Cervical back: Normal range of motion and neck supple.  Skin:    General: Skin is warm and dry.     Findings: No rash.  Neurological:     Mental Status: She is alert.  Psychiatric:        Mood and Affect: Mood is not anxious or depressed.        Speech: Speech normal.        Behavior: Behavior normal. Behavior is cooperative.        Thought Content: Thought content normal.         Judgment: Judgment normal.       Results for orders placed or performed in visit on 04/08/22  [facility] COLONOSCOPY  Result Value Ref Range   [facility] Colonoscopy See Report (in chart) See Report (in chart), Patient Reported     COVID  19 screen:  No recent travel or known exposure to COVID19 The patient denies respiratory symptoms of COVID 19 at this time. The importance of social distancing was discussed today.   Assessment and Plan Problem List Items Addressed This Visit     LLQ pain - Primary    Acute, differential includes acute diverticulitis, mild versus viral/bacterial gastroenteritis versus functional diarrhea. Not consistent with appendicitis or cholecystitis.  We discussed consideration of labs and imaging and possible treatment.  We will begin with complete blood metabolic panel and CBC to make sure electrolytes and renal function remain normal as well as no clear sign of infection.  If white blood cells are not elevated given pain is mild she will continue clear liquids push fluids and follow-up next week if not improving.  If she has severe symptoms we reviewed ER precautions.  She will call next week if symptoms continued for possible stool pathogen testing versus abdominal CT      Relevant Orders   Comprehensive metabolic panel   CBC with Differential/Platelet   Other Visit Diagnoses     Acute diarrhea       Relevant Orders   Comprehensive metabolic panel   CBC with Differential/Platelet          Eliezer Lofts, MD

## 2022-05-15 ENCOUNTER — Other Ambulatory Visit: Payer: Self-pay | Admitting: Family Medicine

## 2022-05-31 ENCOUNTER — Other Ambulatory Visit: Payer: Self-pay | Admitting: Family Medicine

## 2022-05-31 DIAGNOSIS — I10 Essential (primary) hypertension: Secondary | ICD-10-CM

## 2022-06-03 ENCOUNTER — Other Ambulatory Visit: Payer: BC Managed Care – PPO

## 2022-06-08 ENCOUNTER — Ambulatory Visit (INDEPENDENT_AMBULATORY_CARE_PROVIDER_SITE_OTHER): Payer: BC Managed Care – PPO | Admitting: Family Medicine

## 2022-06-08 ENCOUNTER — Encounter: Payer: Self-pay | Admitting: Family Medicine

## 2022-06-08 VITALS — BP 136/88 | HR 77 | Temp 97.7°F | Ht 66.0 in | Wt 181.0 lb

## 2022-06-08 DIAGNOSIS — G43909 Migraine, unspecified, not intractable, without status migrainosus: Secondary | ICD-10-CM

## 2022-06-08 DIAGNOSIS — I10 Essential (primary) hypertension: Secondary | ICD-10-CM

## 2022-06-08 DIAGNOSIS — Z0000 Encounter for general adult medical examination without abnormal findings: Secondary | ICD-10-CM

## 2022-06-08 DIAGNOSIS — R252 Cramp and spasm: Secondary | ICD-10-CM

## 2022-06-08 DIAGNOSIS — Z7189 Other specified counseling: Secondary | ICD-10-CM

## 2022-06-08 DIAGNOSIS — H6120 Impacted cerumen, unspecified ear: Secondary | ICD-10-CM

## 2022-06-08 MED ORDER — SUMATRIPTAN SUCCINATE 50 MG PO TABS: ORAL_TABLET | ORAL | 5 refills | Status: DC

## 2022-06-08 MED ORDER — LOSARTAN POTASSIUM 50 MG PO TABS: 50.0000 mg | ORAL_TABLET | Freq: Every day | ORAL | 0 refills | Status: DC

## 2022-06-08 MED ORDER — DICLOFENAC SODIUM 75 MG PO TBEC: 75.0000 mg | DELAYED_RELEASE_TABLET | Freq: Two times a day (BID) | ORAL | 3 refills | Status: DC

## 2022-06-08 MED ORDER — AMLODIPINE BESYLATE 5 MG PO TABS: 5.0000 mg | ORAL_TABLET | Freq: Every day | ORAL | 3 refills | Status: DC

## 2022-06-08 MED ORDER — METHOCARBAMOL 500 MG PO TABS: 500.0000 mg | ORAL_TABLET | Freq: Two times a day (BID) | ORAL | 3 refills | Status: AC | PRN

## 2022-06-08 NOTE — Patient Instructions (Addendum)
You can call for a mammogram at [facility].  [place] front about a fasting lab visit.   Take care.  Glad to see you. Rinse your ear canals with warm water in the shower.

## 2022-06-08 NOTE — Progress Notes (Unsigned)
CPE- See plan.  Routine anticipatory guidance given to patient.  See health maintenance.  The possibility exists that previously documented standard health maintenance information may have been brought forward from a previous encounter into this note.  If needed, that same information has been updated to reflect the current situation based on today's encounter.    Tetanus 2017 Flu due this fall.   PNA due at 67 shingrix d/w pt. covid 2021 Mammogram encouraged.  DXA due at 44 Colonoscopy 2023 Pap d/w pt.  Prev done, no h/o abnomals.   Husband designated if patient were incapacitated.   L ear with likely cerumen impaction.  Used debrox.  See exam.  She consented for cerumen removal.  Recent cough improved with Robitussin use.  No fevers.  Hypertension:    Using medication without problems or lightheadedness: yes Chest pain with exertion:no Edema:not now, resolved in L leg after prev knee injection.   Short of breath:no  Leg cramping.  Spasms prev improved on robaxin.  Then she was able to wean off.  She went for months doing well, then return of sx recently.  Noted in B feet at the same time, at rest.  No claudication.    Migraines d/w pt.  Taking imitrex prn, with relief.  Some L sided HA upon waking.  Caffeine helps with AM HA.    Prev abd pain resolved.   PMH and SH reviewed  Meds, vitals, and allergies reviewed.   ROS: Per HPI.  Unless specifically indicated otherwise in HPI, the patient denies:  General: fever. Eyes: acute vision changes ENT: sore throat Cardiovascular: chest pain Respiratory: SOB GI: vomiting GU: dysuria Musculoskeletal: acute back pain Derm: acute rash Neuro: acute motor dysfunction Psych: worsening mood Endocrine: polydipsia Heme: bleeding Allergy: hayfever  GEN: nad, alert and oriented HEENT: mucous membranes moist NECK: supple w/o LA CV: rrr. PULM: ctab, no inc wob ABD: soft, +bs EXT: no edema SKIN: no acute rash R SOM noted.  L  cerumen impaction resolved with curette.  Tolerated well.  No complication.  Recheck normal.

## 2022-06-09 ENCOUNTER — Other Ambulatory Visit (INDEPENDENT_AMBULATORY_CARE_PROVIDER_SITE_OTHER): Payer: BC Managed Care – PPO

## 2022-06-09 DIAGNOSIS — I10 Essential (primary) hypertension: Secondary | ICD-10-CM

## 2022-06-09 LAB — CBC WITH DIFFERENTIAL/PLATELET
Basophils Absolute: 0.1 10*3/uL (ref 0.0–0.1)
Basophils Relative: 0.7 % (ref 0.0–3.0)
Eosinophils Absolute: 0.2 10*3/uL (ref 0.0–0.7)
Eosinophils Relative: 1.8 % (ref 0.0–5.0)
HCT: 41.2 % (ref 36.0–46.0)
Hemoglobin: 13.8 g/dL (ref 12.0–15.0)
Lymphocytes Relative: 30.3 % (ref 12.0–46.0)
Lymphs Abs: 2.7 10*3/uL (ref 0.7–4.0)
MCHC: 33.5 g/dL (ref 30.0–36.0)
MCV: 89.4 fl (ref 78.0–100.0)
Monocytes Absolute: 0.4 10*3/uL (ref 0.1–1.0)
Monocytes Relative: 4.7 % (ref 3.0–12.0)
Neutro Abs: 5.5 10*3/uL (ref 1.4–7.7)
Neutrophils Relative %: 62.5 % (ref 43.0–77.0)
Platelets: 284 10*3/uL (ref 150.0–400.0)
RBC: 4.61 Mil/uL (ref 3.87–5.11)
RDW: 14.1 % (ref 11.5–15.5)
WBC: 8.8 10*3/uL (ref 4.0–10.5)

## 2022-06-09 LAB — LIPID PANEL
Cholesterol: 245 mg/dL — ABNORMAL HIGH (ref 0–200)
HDL: 58.2 mg/dL (ref >39.00)
LDL Cholesterol: 151 mg/dL — ABNORMAL HIGH (ref 0–99)
NonHDL: 186.7
Total CHOL/HDL Ratio: 4
Triglycerides: 179 mg/dL — ABNORMAL HIGH (ref 0.0–149.0)
VLDL: 35.8 mg/dL (ref 0.0–40.0)

## 2022-06-09 LAB — COMPREHENSIVE METABOLIC PANEL
ALT: 28 U/L (ref 0–35)
AST: 21 U/L (ref 0–37)
Albumin: 4.6 g/dL (ref 3.5–5.2)
Alkaline Phosphatase: 69 U/L (ref 39–117)
BUN: 13 mg/dL (ref 6–23)
CO2: 24 mEq/L (ref 19–32)
Calcium: 9.9 mg/dL (ref 8.4–10.5)
Chloride: 106 mEq/L (ref 96–112)
Creatinine, Ser: 0.63 mg/dL (ref 0.40–1.20)
GFR: 93.92 mL/min (ref >60.00)
Glucose, Bld: 93 mg/dL (ref 70–99)
Potassium: 3.7 mEq/L (ref 3.5–5.1)
Sodium: 140 mEq/L (ref 135–145)
Total Bilirubin: 1.5 mg/dL — ABNORMAL HIGH (ref 0.2–1.2)
Total Protein: 7 g/dL (ref 6.0–8.3)

## 2022-06-09 LAB — TSH: TSH: 2.18 u[IU]/mL (ref 0.35–5.50)

## 2022-06-10 DIAGNOSIS — H6120 Impacted cerumen, unspecified ear: Secondary | ICD-10-CM | POA: Insufficient documentation

## 2022-06-10 NOTE — Assessment & Plan Note (Signed)
Can use Robaxin as needed, gently stretch, stay hydrated, and return for follow-up labs.

## 2022-06-10 NOTE — Assessment & Plan Note (Signed)
We will try tapering caffeine and use Imitrex as needed.

## 2022-06-10 NOTE — Assessment & Plan Note (Signed)
Tetanus 2017 Flu due this fall.   PNA due at 67 shingrix d/w pt. covid 2021 Mammogram encouraged.  DXA due at 68 Colonoscopy 2023 Pap d/w pt.  Prev done, no h/o abnomals.   Husband designated if patient were incapacitated.

## 2022-06-10 NOTE — Assessment & Plan Note (Addendum)
Return for follow-up labs.Continue amlodipine and losartan.

## 2022-06-10 NOTE — Assessment & Plan Note (Signed)
Husband designated if patient were incapacitated. 

## 2022-06-10 NOTE — Assessment & Plan Note (Signed)
Resolved.  See above.

## 2022-06-15 ENCOUNTER — Telehealth: Payer: Self-pay | Admitting: Family Medicine

## 2022-06-15 NOTE — Telephone Encounter (Signed)
Patient has been notified of results.  

## 2022-06-15 NOTE — Telephone Encounter (Signed)
Patient would like a phone call to discuss lab results.

## 2022-08-27 DIAGNOSIS — H11422 Conjunctival edema, left eye: Secondary | ICD-10-CM | POA: Diagnosis not present

## 2022-12-10 ENCOUNTER — Other Ambulatory Visit: Payer: Self-pay | Admitting: Family Medicine

## 2023-03-05 DIAGNOSIS — D225 Melanocytic nevi of trunk: Secondary | ICD-10-CM | POA: Diagnosis not present

## 2023-03-05 DIAGNOSIS — L718 Other rosacea: Secondary | ICD-10-CM | POA: Diagnosis not present

## 2023-03-05 DIAGNOSIS — L218 Other seborrheic dermatitis: Secondary | ICD-10-CM | POA: Diagnosis not present

## 2023-03-05 DIAGNOSIS — L817 Pigmented purpuric dermatosis: Secondary | ICD-10-CM | POA: Diagnosis not present

## 2023-06-08 DIAGNOSIS — M25562 Pain in left knee: Secondary | ICD-10-CM | POA: Diagnosis not present

## 2023-06-13 ENCOUNTER — Other Ambulatory Visit: Payer: Self-pay | Admitting: Family Medicine

## 2023-06-14 ENCOUNTER — Other Ambulatory Visit: Payer: Self-pay | Admitting: Family Medicine

## 2023-06-14 NOTE — Telephone Encounter (Signed)
Please call patient due for CPE and labs. Once scheduled send back for review of refill

## 2023-06-14 NOTE — Telephone Encounter (Signed)
Vm isn't set up to leave vm, sent mychart message.

## 2023-06-14 NOTE — Telephone Encounter (Signed)
Patient is due for CPE. Mychart message has been sent, awaiting response from patient.

## 2023-06-15 NOTE — Telephone Encounter (Signed)
Spoke to pt, scheduled cpe for 07/05/23

## 2023-06-15 NOTE — Telephone Encounter (Signed)
Please call patient to set up cpe. Let me know when set up will have provider review refill request.

## 2023-06-16 NOTE — Telephone Encounter (Signed)
Patient has been scheduled to be seen. Do you want to fill this prescription?

## 2023-06-16 NOTE — Telephone Encounter (Signed)
Sent. Thanks.   

## 2023-06-17 ENCOUNTER — Other Ambulatory Visit: Payer: Self-pay | Admitting: Family Medicine

## 2023-06-18 NOTE — Telephone Encounter (Signed)
Refill request for SUMAtriptan (IMITREX) 50 MG tablet   LOV - 06/08/22 Next OV - 07/05/23 Last refill - 06/08/22 #10/5

## 2023-06-24 ENCOUNTER — Other Ambulatory Visit: Payer: Self-pay | Admitting: Family Medicine

## 2023-06-24 DIAGNOSIS — I10 Essential (primary) hypertension: Secondary | ICD-10-CM

## 2023-06-28 ENCOUNTER — Other Ambulatory Visit (INDEPENDENT_AMBULATORY_CARE_PROVIDER_SITE_OTHER): Payer: BC Managed Care – PPO

## 2023-06-28 DIAGNOSIS — I10 Essential (primary) hypertension: Secondary | ICD-10-CM | POA: Diagnosis not present

## 2023-06-28 LAB — COMPREHENSIVE METABOLIC PANEL
ALT: 30 U/L (ref 0–35)
AST: 19 U/L (ref 0–37)
Albumin: 4.2 g/dL (ref 3.5–5.2)
Alkaline Phosphatase: 56 U/L (ref 39–117)
BUN: 18 mg/dL (ref 6–23)
CO2: 29 meq/L (ref 19–32)
Calcium: 9.6 mg/dL (ref 8.4–10.5)
Chloride: 105 meq/L (ref 96–112)
Creatinine, Ser: 0.8 mg/dL (ref 0.40–1.20)
GFR: 77.43 mL/min (ref >60.00)
Glucose, Bld: 90 mg/dL (ref 70–99)
Potassium: 4 meq/L (ref 3.5–5.1)
Sodium: 140 meq/L (ref 135–145)
Total Bilirubin: 1.2 mg/dL (ref 0.2–1.2)
Total Protein: 6.8 g/dL (ref 6.0–8.3)

## 2023-06-28 LAB — LIPID PANEL
Cholesterol: 217 mg/dL — ABNORMAL HIGH (ref 0–200)
HDL: 57.6 mg/dL (ref >39.00)
LDL Cholesterol: 137 mg/dL — ABNORMAL HIGH (ref 0–99)
NonHDL: 159.7
Total CHOL/HDL Ratio: 4
Triglycerides: 113 mg/dL (ref 0.0–149.0)
VLDL: 22.6 mg/dL (ref 0.0–40.0)

## 2023-07-05 ENCOUNTER — Encounter: Payer: Self-pay | Admitting: Family Medicine

## 2023-07-05 ENCOUNTER — Ambulatory Visit: Payer: BC Managed Care – PPO | Admitting: Family Medicine

## 2023-07-05 VITALS — BP 128/74 | HR 89 | Temp 98.8°F | Ht 66.5 in | Wt 189.4 lb

## 2023-07-05 DIAGNOSIS — Z0000 Encounter for general adult medical examination without abnormal findings: Secondary | ICD-10-CM | POA: Diagnosis not present

## 2023-07-05 DIAGNOSIS — Z7189 Other specified counseling: Secondary | ICD-10-CM

## 2023-07-05 DIAGNOSIS — M25569 Pain in unspecified knee: Secondary | ICD-10-CM

## 2023-07-05 DIAGNOSIS — G43909 Migraine, unspecified, not intractable, without status migrainosus: Secondary | ICD-10-CM

## 2023-07-05 DIAGNOSIS — I10 Essential (primary) hypertension: Secondary | ICD-10-CM

## 2023-07-05 DIAGNOSIS — Z1239 Encounter for other screening for malignant neoplasm of breast: Secondary | ICD-10-CM

## 2023-07-05 DIAGNOSIS — I8390 Asymptomatic varicose veins of unspecified lower extremity: Secondary | ICD-10-CM

## 2023-07-05 DIAGNOSIS — Z13820 Encounter for screening for osteoporosis: Secondary | ICD-10-CM

## 2023-07-05 MED ORDER — AMLODIPINE BESYLATE 5 MG PO TABS: 5.0000 mg | ORAL_TABLET | Freq: Every day | ORAL | 3 refills | Status: DC

## 2023-07-05 MED ORDER — LOSARTAN POTASSIUM 50 MG PO TABS: 50.0000 mg | ORAL_TABLET | Freq: Every day | ORAL | 3 refills | Status: DC

## 2023-07-05 NOTE — Progress Notes (Unsigned)
CPE- See plan.  Routine anticipatory guidance given to patient.  See health maintenance.  The possibility exists that previously documented standard health maintenance information may have been brought forward from a previous encounter into this note.  If needed, that same information has been updated to reflect the current situation based on today's encounter.    Tetanus 2017 Flu due this fall.   PNA due at 67 shingrix d/w pt. covid 2021 Mammogram ordered 2024 DXA ordered 2024 Colonoscopy 2023 Pap d/w pt.  Prev done, no h/o abnomals.   Husband designated if patient were incapacitated.   EKG prev done, in EMR.  Hearing well.  Passes whisper test.  Still wearing contacts.  Has had eye clinic eval.    Varicose vein L leg, posterior knee.  D/w pt about compression stockings.  Vascular referral ordered.  She has used prep H to deal with a hemorrhoid, d/w pt about seeing GI if needed.    Hypertension:    Using medication without problems or lightheadedness: yes Chest pain with exertion:no Edema:none now but had some prev in the L>R ankle and that improved with prev knee injection.   Short of breath:no Labs d/w pt.   Had seen ortho about her knee with prev injection done.  She is trying to put off surgery.    Migraine, prn use of imitrex.  It helps.  Can go months w/o use.    PMH and SH reviewed  Meds, vitals, and allergies reviewed.   ROS: Per HPI.  Unless specifically indicated otherwise in HPI, the patient denies:  General: fever. Eyes: acute vision changes ENT: sore throat Cardiovascular: chest pain Respiratory: SOB GI: vomiting GU: dysuria Musculoskeletal: acute back pain Derm: acute rash Neuro: acute motor dysfunction Psych: worsening mood Endocrine: polydipsia Heme: bleeding Allergy: hayfever  GEN: nad, alert and oriented HEENT: ncat NECK: supple w/o LA CV: rrr. PULM: ctab, no inc wob ABD: soft, +bs EXT: no edema SKIN: no acute rash Varicose vein L leg,  posterior knee.  No ulceration.  Nontender.

## 2023-07-05 NOTE — Patient Instructions (Addendum)
You can call for a mammogram and bone density test at [facility] at [facility].  [address] [place] 336 [id number]  Try a compression stocking and I put in the referral to vascular.   Take care.  Glad to see you.

## 2023-07-07 ENCOUNTER — Ambulatory Visit
Admission: RE | Admit: 2023-07-07 | Discharge: 2023-07-07 | Disposition: A | Discharge status: Home / Self Care | Payer: BC Managed Care – PPO | Source: Ambulatory Visit | Attending: Family Medicine | Admitting: Family Medicine

## 2023-07-07 ENCOUNTER — Inpatient Hospital Stay
Admission: RE | Admit: 2023-07-07 | Discharge: 2023-07-07 | Disposition: A | Discharge status: Home / Self Care | Payer: Self-pay | Source: Ambulatory Visit | Attending: Family Medicine | Admitting: Family Medicine

## 2023-07-07 ENCOUNTER — Other Ambulatory Visit: Payer: Self-pay | Admitting: *Deleted

## 2023-07-07 DIAGNOSIS — Z1239 Encounter for other screening for malignant neoplasm of breast: Secondary | ICD-10-CM | POA: Diagnosis not present

## 2023-07-07 DIAGNOSIS — Z780 Asymptomatic menopausal state: Secondary | ICD-10-CM | POA: Diagnosis not present

## 2023-07-07 DIAGNOSIS — Z1231 Encounter for screening mammogram for malignant neoplasm of breast: Secondary | ICD-10-CM

## 2023-07-07 DIAGNOSIS — Z13820 Encounter for screening for osteoporosis: Secondary | ICD-10-CM

## 2023-07-07 NOTE — Assessment & Plan Note (Signed)
Migraine, prn use of imitrex.  It helps.  Can go months w/o use.   Continue as is.

## 2023-07-07 NOTE — Assessment & Plan Note (Signed)
Per orthopedics 

## 2023-07-07 NOTE — Assessment & Plan Note (Signed)
Continue amlodipine and losartan.  Continue work on diet and exercise.

## 2023-07-07 NOTE — Assessment & Plan Note (Signed)
Husband designated if patient were incapacitated. 

## 2023-07-07 NOTE — Assessment & Plan Note (Signed)
Varicose vein L leg, posterior knee.  D/w pt about compression stockings.  Vascular referral ordered.

## 2023-07-07 NOTE — Assessment & Plan Note (Signed)
Tetanus 2017 Flu due this fall.   PNA due at 67 shingrix d/w pt. covid 2021 Mammogram ordered 2024 DXA ordered 2024 Colonoscopy 2023 Pap d/w pt.  Prev done, no h/o abnomals.   Husband designated if patient were incapacitated.   EKG prev done, in EMR.  Hearing well.  Passes whisper test.  Still wearing contacts.  Has had eye clinic eval.

## 2023-09-01 DIAGNOSIS — I872 Venous insufficiency (chronic) (peripheral): Secondary | ICD-10-CM | POA: Insufficient documentation

## 2023-09-01 NOTE — Progress Notes (Deleted)
 MRN : [id number]  Yolanda Delgado is a 68 y.o. (Dec 25, 1955) female who presents with chief complaint of varicose veins hurt.  History of Present Illness:    Recommend:  The patient has large symptomatic varicose veins that are painful and associated with swelling. The patient is CEAP C4sEpAsPr   I have had a long discussion with the patient regarding  varicose veins and why they cause symptoms.  Patient will begin wearing graduated compression stockings class 1 on a daily basis, beginning first thing in the morning and removing them in the evening. The patient is instructed specifically not to sleep in the stockings.    The patient  will also begin using over-the-counter analgesics such as Motrin 600 mg po TID to help control the symptoms.    In addition, behavioral modification including elevation during the day will be initiated.    Pending the results of these changes the  patient will be reevaluated in three months.   An ultrasound of the venous system will be obtained.   Further plans will be based on the ultrasound results and whether conservative therapies are successful at eliminating the pain and swelling.   No outpatient medications have been marked as taking for the 09/02/23 encounter (Appointment) with Jama, Cordella MATSU, MD.    Past Medical History:  Diagnosis Date   Colon polyp    Family history of B12 deficiency    Fatty liver    Hyperlipidemia    Hypertension    Hypokalemia    with diuretic use   Migraine    Ocular rosacea    Palpitation    due to caffeine    Past Surgical History:  Procedure Laterality Date   GALLBLADDER SURGERY     TONSILLECTOMY AND ADENOIDECTOMY     UTERINE ABLATION     WISDOM TOOTH EXTRACTION      Social History Social History   Tobacco Use   Smoking status: Never   Smokeless tobacco: Never  Substance Use Topics   Alcohol use: No   Drug use: No    Family History Family History  Problem Relation Age of Onset    Renal cancer Mother    Breast cancer Mother 44   Cancer Mother    Stroke Father    Breast cancer Paternal Grandmother    Colon cancer Cousin     Allergies  Allergen Reactions   Ace Inhibitors Other (See Comments)    H/o leg cramping with ACE use.    Hydrochlorothiazide Other (See Comments)    Risk of hypokalemia- prev hospitalized.     Lasix [Furosemide] Other (See Comments)    Risk of hypokalemia- prev hospitalized.   Pamabrom Other (See Comments)    Low potassium   Zyrtec [Cetirizine] Other (See Comments)    Worsening dry eyes     REVIEW OF SYSTEMS (Negative unless checked)  Constitutional: [] Weight loss  [] Fever  [] Chills Cardiac: [] Chest pain   [] Chest pressure   [] Palpitations   [] Shortness of breath when laying flat   [] Shortness of breath with exertion. Vascular:  [] Pain in legs with walking   [x] Pain in legs with standing  [] History of DVT   [] Phlebitis   [] Swelling in legs   [x] Varicose veins   [] Non-healing ulcers Pulmonary:   [] Uses home oxygen   [] Productive cough   [] Hemoptysis   [] Wheeze  [] COPD   [] Asthma Neurologic:  [] Dizziness   [] Seizures   [] History  of stroke   [] History of TIA  [] Aphasia   [] Vissual changes   [] Weakness or numbness in arm   [] Weakness or numbness in leg Musculoskeletal:   [] Joint swelling   [] Joint pain   [] Low back pain Hematologic:  [] Easy bruising  [] Easy bleeding   [] Hypercoagulable state   [] Anemic Gastrointestinal:  [] Diarrhea   [] Vomiting  [] Gastroesophageal reflux/heartburn   [] Difficulty swallowing. Genitourinary:  [] Chronic kidney disease   [] Difficult urination  [] Frequent urination   [] Blood in urine Skin:  [] Rashes   [] Ulcers  Psychological:  [] History of anxiety   []  History of major depression.  Physical Examination  There were no vitals filed for this visit. There is no height or weight on file to calculate BMI. Gen: WD/WN, NAD Head: [place]/AT, No temporalis wasting.  Ear/Nose/Throat: Hearing grossly intact, nares w/o  erythema or drainage, pinna without lesions Eyes: PER, EOMI, sclera nonicteric.  Neck: Supple, no gross masses.  No JVD.  Pulmonary:  Good air movement, no audible wheezing, no use of accessory muscles.  Cardiac: RRR, precordium not hyperdynamic. Vascular:  Large varicosities present, greater than 10 mm ***.  Veins are tender to palpation  Mild venous stasis changes to the legs bilaterally.  Trace soft pitting edema CEAP C3sEpAsPr Vessel Right Left  Radial Palpable Palpable  Gastrointestinal: soft, non-distended. No guarding/no peritoneal signs.  Musculoskeletal: M/S 5/5 throughout.  No deformity.  Neurologic: CN 2-12 intact. Pain and light touch intact in extremities.  Symmetrical.  Speech is fluent. Motor exam as listed above. Psychiatric: Judgment intact, Mood & affect appropriate for pt's clinical situation. Dermatologic: Venous rashes no ulcers noted.  No changes consistent with cellulitis. Lymph : No lichenification or skin changes of chronic lymphedema.  CBC Lab Results  Component Value Date   WBC 8.8 06/09/2022   HGB 13.8 06/09/2022   HCT 41.2 06/09/2022   MCV 89.4 06/09/2022   PLT 284.0 06/09/2022    BMET    Component Value Date/Time   NA 140 06/28/2023 1026   NA 139 12/31/2011 2210   K 4.0 06/28/2023 1026   K 3.6 12/31/2011 2210   CL 105 06/28/2023 1026   CL 106 12/31/2011 2210   CO2 29 06/28/2023 1026   CO2 27 12/31/2011 2210   GLUCOSE 90 06/28/2023 1026   GLUCOSE 131 (H) 12/31/2011 2210   BUN 18 06/28/2023 1026   BUN 14 12/31/2011 2210   CREATININE 0.80 06/28/2023 1026   CREATININE 0.72 09/17/2017 1454   CALCIUM  9.6 06/28/2023 1026   CALCIUM  8.9 12/31/2011 2210   GFRNONAA >60 12/31/2011 2210   GFRAA >60 12/31/2011 2210   CrCl cannot be calculated (Patient's most recent lab result is older than the maximum 21 days allowed.).  COAG No results found for: INR, PROTIME  Radiology No results found.   Assessment/Plan There are no diagnoses linked to  this encounter.   Cordella Shawl, MD  09/01/2023 7:48 AM

## 2023-09-02 ENCOUNTER — Encounter (INDEPENDENT_AMBULATORY_CARE_PROVIDER_SITE_OTHER): Payer: Self-pay | Admitting: Vascular Surgery

## 2023-09-02 DIAGNOSIS — E785 Hyperlipidemia, unspecified: Secondary | ICD-10-CM

## 2023-09-02 DIAGNOSIS — I872 Venous insufficiency (chronic) (peripheral): Secondary | ICD-10-CM

## 2023-09-02 DIAGNOSIS — I8310 Varicose veins of unspecified lower extremity with inflammation: Secondary | ICD-10-CM

## 2023-09-02 DIAGNOSIS — I10 Essential (primary) hypertension: Secondary | ICD-10-CM

## 2023-09-16 ENCOUNTER — Ambulatory Visit: Payer: Self-pay | Admitting: Family Medicine

## 2023-09-16 ENCOUNTER — Encounter: Payer: Self-pay | Admitting: Family Medicine

## 2023-09-16 ENCOUNTER — Ambulatory Visit: Payer: BC Managed Care – PPO | Admitting: Family Medicine

## 2023-09-16 VITALS — BP 128/78 | HR 80 | Temp 98.7°F | Ht 66.5 in | Wt 190.2 lb

## 2023-09-16 DIAGNOSIS — H6120 Impacted cerumen, unspecified ear: Secondary | ICD-10-CM

## 2023-09-16 DIAGNOSIS — I10 Essential (primary) hypertension: Secondary | ICD-10-CM | POA: Diagnosis not present

## 2023-09-16 DIAGNOSIS — B349 Viral infection, unspecified: Secondary | ICD-10-CM

## 2023-09-16 NOTE — Patient Instructions (Signed)
Rest and fluids. Tylenol as needed.   Flonase in the meantime.  Gently try to pop your ears.  Take care.  Glad to see you. If you continue to have pain or worsening symptoms, then let me know.

## 2023-09-16 NOTE — Telephone Encounter (Signed)
Copied from CRM [id number]. Topic: Clinical - Red Word Triage >> Sep 16, 2023  8:21 AM Shelbie Proctor wrote: Red Word that prompted transfer to Nurse Triage: Patient [phone] states left ear stocked up now very painful to touch and head congestion. Patient denies a fever.   Chief Complaint: left ear pain Symptoms: lump at base of ear painful to touch 3/10 constant Frequency: ongoing since yesterday Pertinent Negatives: Patient denies fever Disposition: [] ED /[] Urgent Care (no appt availability in office) / [x] Appointment(In office/virtual)/ []  [facility]/ [] Home Care/ [] Refused Recommended Disposition /[] [place] [facility]/ []  Follow-up with PCP Additional Notes: The patient reported constant aching left ear pain that started yesterday.  This morning she noticed a lump at the base of her ear that she thinks may be due to an infection.  She stated that she has wax build up in her ear and thinks the issue is related.  She feels slightly unsteady, not like the room is spinning but "I don't fel right." She was scheduled for a same day appointment for further evaluation.   Reason for Disposition  Walking is very unsteady or feels very dizzy  Answer Assessment - Initial Assessment Questions 1. LOCATION: "Which ear is involved?"     Left ear  2. ONSET: "When did the ear start hurting"      09/15/2023 in the morning  3. SEVERITY: "How bad is the pain?"  (Scale 1-10; mild, moderate or severe)   - MILD (1-3): doesn't interfere with normal activities    - MODERATE (4-7): interferes with normal activities or awakens from sleep    - SEVERE (8-10): excruciating pain, unable to do any normal activities      Hurts worse with slight touch  Constant achy 3/10 4. URI SYMPTOMS: "Do you have a runny nose or cough?"     Slight head congestion  5. FEVER: "Do you have a fever?" If Yes, ask: "What is your temperature, how was it measured, and when did it start?"     No  6. CAUSE: "Have you been  swimming recently?", "How often do you use Q-TIPS?", "Have you had any recent air travel or scuba diving?"     Possible infection due to wax build up  7. OTHER SYMPTOMS: "Do you have any other symptoms?" (e.g., headache, stiff neck, dizziness, vomiting, runny nose, decreased hearing)     Stopped up with wax for 4 weeks; a lump developed at the base of ear that is painful to touch  Protocols used: Davina Poke

## 2023-09-16 NOTE — Telephone Encounter (Signed)
Will see at OV 

## 2023-09-16 NOTE — Progress Notes (Signed)
Some ankle swelling at the end of the day, on amlodipine, d/w pt.  She can manage as is. Would continue as is unless escalation- d/w pt.  Swelling increased when she had more knee sx.  Prev knee injection helped knee pain and ipsilateral swelling improved concurrently.    She hadn't felt well for about 3-4 weeks. She had prev conjunctival irritation, prev vomiting. She has some eye matting at baseline, esp in the AM.  No fevers.  No cough.  No facial pain.    She wanted to get checked re: L ear wax.  She has a tender area inferior/posterior to L ear.  Pinna isn't ttp.    More GERD sx in the meantime, over the last few weeks.   Meds, vitals, and allergies reviewed.   ROS: Per HPI unless specifically indicated in ROS section   Nad Ncat R SOM noted.  L canal with cerumen removed with curette with consent.  No complications.  L SOM noted.  Small mass, likely tender LN, on the L side inferior to the L pinna.  Pinna not ttp No other neck or clavicular or auricular LA.  Nasal irritation noted. MMM Neck supple Rrr Ctab Sinuses not ttp x4

## 2023-09-17 DIAGNOSIS — B349 Viral infection, unspecified: Secondary | ICD-10-CM | POA: Insufficient documentation

## 2023-09-17 NOTE — Assessment & Plan Note (Signed)
Likely viral syndrome with isolated LN on the L side, near the L pinna.  Rest and fluids. Tylenol as needed.   Flonase in the meantime.  Gently perform valsalva.  If continued pain or worsening symptoms, then let me know.

## 2023-09-17 NOTE — Assessment & Plan Note (Signed)
Some ankle swelling at the end of the day, on amlodipine, d/w pt.  She can manage as is. Would continue as is unless escalation- d/w pt.

## 2023-09-17 NOTE — Assessment & Plan Note (Signed)
Resolved, see above.

## 2023-09-29 NOTE — Progress Notes (Signed)
 MRN : [id number]  Yolanda Delgado is a 68 y.o. (1956/05/17) female who presents with chief complaint of varicose veins hurt.  History of Present Illness:   The patient is seen for evaluation of symptomatic varicose veins. The patient relates burning and stinging which worsened steadily throughout the course of the day, particularly with standing. The patient also notes an aching and throbbing pain over the varicosities, particularly with prolonged dependent positions. The symptoms are significantly improved with elevation.  The patient also notes that during hot weather the symptoms are greatly intensified. The patient states the pain from the varicose veins interferes with work, daily exercise, shopping and household maintenance. At this point, the symptoms are persistent and severe enough that they're having a negative impact on lifestyle and are interfering with daily activities.  There is no history of DVT, PE or superficial thrombophlebitis. There is no history of ulceration or hemorrhage. The patient denies a significant family history of varicose veins.  The patient has not worn graduated compression in the past. At the present time the patient has not been using over-the-counter analgesics. There is no history of prior surgical intervention or sclerotherapy.   No outpatient medications have been marked as taking for the 09/30/23 encounter (Appointment) with Jama, Cordella MATSU, MD.    Past Medical History:  Diagnosis Date   Colon polyp    Family history of B12 deficiency    Fatty liver    Hyperlipidemia    Hypertension    Hypokalemia    with diuretic use   Migraine    Ocular rosacea    Palpitation    due to caffeine    Past Surgical History:  Procedure Laterality Date   GALLBLADDER SURGERY     TONSILLECTOMY AND ADENOIDECTOMY     UTERINE ABLATION     WISDOM TOOTH EXTRACTION      Social History Social History   Tobacco Use   Smoking status: Never    Smokeless tobacco: Never  Substance Use Topics   Alcohol use: No   Drug use: No    Family History Family History  Problem Relation Age of Onset   Renal cancer Mother    Breast cancer Mother 2   Cancer Mother    Stroke Father    Breast cancer Paternal Grandmother    Colon cancer Cousin     Allergies  Allergen Reactions   Ace Inhibitors Other (See Comments)    H/o leg cramping with ACE use.    Hydrochlorothiazide Other (See Comments)    Risk of hypokalemia- prev hospitalized.     Lasix [Furosemide] Other (See Comments)    Risk of hypokalemia- prev hospitalized.   Pamabrom Other (See Comments)    Low potassium   Zyrtec [Cetirizine] Other (See Comments)    Worsening dry eyes     REVIEW OF SYSTEMS (Negative unless checked)  Constitutional: [] Weight loss  [] Fever  [] Chills Cardiac: [] Chest pain   [] Chest pressure   [] Palpitations   [] Shortness of breath when laying flat   [] Shortness of breath with exertion. Vascular:  [] Pain in legs with walking   [x] Pain in legs with standing  [] History of DVT   [] Phlebitis   [] Swelling in legs   [x] Varicose veins   [] Non-healing ulcers Pulmonary:   [] Uses home oxygen   [] Productive cough   [] Hemoptysis   [] Wheeze  [] COPD   [] Asthma Neurologic:  [] Dizziness   [] Seizures   [] History  of stroke   [] History of TIA  [] Aphasia   [] Vissual changes   [] Weakness or numbness in arm   [] Weakness or numbness in leg Musculoskeletal:   [] Joint swelling   [] Joint pain   [] Low back pain Hematologic:  [] Easy bruising  [] Easy bleeding   [] Hypercoagulable state   [] Anemic Gastrointestinal:  [] Diarrhea   [] Vomiting  [x] Gastroesophageal reflux/heartburn   [] Difficulty swallowing. Genitourinary:  [] Chronic kidney disease   [] Difficult urination  [] Frequent urination   [] Blood in urine Skin:  [] Rashes   [] Ulcers  Psychological:  [] History of anxiety   []  History of major depression.  Physical Examination  There were no vitals filed for this visit. There is no  height or weight on file to calculate BMI. Gen: WD/WN, NAD Head: [place]/AT, No temporalis wasting.  Ear/Nose/Throat: Hearing grossly intact, nares w/o erythema or drainage, pinna without lesions Eyes: PER, EOMI, sclera nonicteric.  Neck: Supple, no gross masses.  No JVD.  Pulmonary:  Good air movement, no audible wheezing, no use of accessory muscles.  Cardiac: RRR, precordium not hyperdynamic. Vascular:  Large varicosities present, greater than 10 mm bilaterally.  Veins are tender to palpation  Mild venous stasis changes to the legs bilaterally.  Trace soft pitting edema CEAP C3sEpAsPr Vessel Right Left  Radial Palpable Palpable  Gastrointestinal: soft, non-distended. No guarding/no peritoneal signs.  Musculoskeletal: M/S 5/5 throughout.  No deformity.  Neurologic: CN 2-12 intact. Pain and light touch intact in extremities.  Symmetrical.  Speech is fluent. Motor exam as listed above. Psychiatric: Judgment intact, Mood & affect appropriate for pt's clinical situation. Dermatologic: Venous rashes no ulcers noted.  No changes consistent with cellulitis. Lymph : No lichenification or skin changes of chronic lymphedema.  CBC Lab Results  Component Value Date   WBC 8.8 06/09/2022   HGB 13.8 06/09/2022   HCT 41.2 06/09/2022   MCV 89.4 06/09/2022   PLT 284.0 06/09/2022    BMET    Component Value Date/Time   NA 140 06/28/2023 1026   NA 139 12/31/2011 2210   K 4.0 06/28/2023 1026   K 3.6 12/31/2011 2210   CL 105 06/28/2023 1026   CL 106 12/31/2011 2210   CO2 29 06/28/2023 1026   CO2 27 12/31/2011 2210   GLUCOSE 90 06/28/2023 1026   GLUCOSE 131 (H) 12/31/2011 2210   BUN 18 06/28/2023 1026   BUN 14 12/31/2011 2210   CREATININE 0.80 06/28/2023 1026   CREATININE 0.72 09/17/2017 1454   CALCIUM  9.6 06/28/2023 1026   CALCIUM  8.9 12/31/2011 2210   GFRNONAA >60 12/31/2011 2210   GFRAA >60 12/31/2011 2210   CrCl cannot be calculated (Patient's most recent lab result is older than the  maximum 21 days allowed.).  COAG No results found for: INR, PROTIME  Radiology No results found.   Assessment/Plan 1. Varicose veins with inflammation, unspecified laterality (Primary)  Recommend:  The patient has large symptomatic varicose veins that are painful and associated with swelling. The patient is CEAP C4sEpAsPr   I have had a long discussion with the patient regarding  varicose veins and why they cause symptoms.  Patient will begin wearing graduated compression stockings class 1 on a daily basis, beginning first thing in the morning and removing them in the evening. The patient is instructed specifically not to sleep in the stockings.    The patient  will also begin using over-the-counter analgesics such as Motrin 600 mg po TID to help control the symptoms.    In addition, behavioral modification  including elevation during the day will be initiated.    Pending the results of these changes the  patient will be reevaluated in three months.   An ultrasound of the venous system will be obtained.   Further plans will be based on the ultrasound results and whether conservative therapies are successful at eliminating the pain and swelling.  - VAS US  LOWER EXTREMITY VENOUS REFLUX; Future  2. Chronic venous insufficiency  Recommend:  The patient has large symptomatic varicose veins that are painful and associated with swelling. The patient is CEAP C4sEpAsPr   I have had a long discussion with the patient regarding  varicose veins and why they cause symptoms.  Patient will begin wearing graduated compression stockings class 1 on a daily basis, beginning first thing in the morning and removing them in the evening. The patient is instructed specifically not to sleep in the stockings.    The patient  will also begin using over-the-counter analgesics such as Motrin 600 mg po TID to help control the symptoms.    In addition, behavioral modification including elevation during the  day will be initiated.    Pending the results of these changes the  patient will be reevaluated in three months.   An ultrasound of the venous system will be obtained.   Further plans will be based on the ultrasound results and whether conservative therapies are successful at eliminating the pain and swelling.   3. Primary hypertension Continue antihypertensive medications as already ordered, these medications have been reviewed and there are no changes at this time.  4. Hyperlipidemia, unspecified hyperlipidemia type Continue statin as ordered and reviewed, no changes at this time    Cordella Shawl, MD  09/29/2023 8:00 AM

## 2023-09-30 ENCOUNTER — Ambulatory Visit (INDEPENDENT_AMBULATORY_CARE_PROVIDER_SITE_OTHER): Payer: BC Managed Care – PPO | Admitting: Vascular Surgery

## 2023-09-30 ENCOUNTER — Encounter (INDEPENDENT_AMBULATORY_CARE_PROVIDER_SITE_OTHER): Payer: Self-pay | Admitting: Vascular Surgery

## 2023-09-30 VITALS — BP 143/88 | HR 82 | Resp 18 | Ht 66.5 in | Wt 191.4 lb

## 2023-09-30 DIAGNOSIS — E785 Hyperlipidemia, unspecified: Secondary | ICD-10-CM | POA: Diagnosis not present

## 2023-09-30 DIAGNOSIS — I8310 Varicose veins of unspecified lower extremity with inflammation: Secondary | ICD-10-CM

## 2023-09-30 DIAGNOSIS — I872 Venous insufficiency (chronic) (peripheral): Secondary | ICD-10-CM | POA: Diagnosis not present

## 2023-09-30 DIAGNOSIS — I10 Essential (primary) hypertension: Secondary | ICD-10-CM | POA: Diagnosis not present

## 2023-10-03 ENCOUNTER — Encounter (INDEPENDENT_AMBULATORY_CARE_PROVIDER_SITE_OTHER): Payer: Self-pay | Admitting: Vascular Surgery

## 2023-11-28 DIAGNOSIS — I890 Lymphedema, not elsewhere classified: Secondary | ICD-10-CM | POA: Insufficient documentation

## 2023-11-28 NOTE — Progress Notes (Unsigned)
 MRN : [id number]  Yolanda Delgado is a 68 y.o. (22-Jul-1956) female who presents with chief complaint of legs swell.  History of Present Illness:   The patient returns to the office for followup evaluation regarding leg swelling.  The swelling has persisted and the pain associated with swelling continues. There have not been any interval development of a ulcerations or wounds.  Since the previous visit the patient has been wearing graduated compression stockings and has noted little if any improvement in the lymphedema. The patient has been using compression routinely morning until night.  The patient also states elevation during the day and exercise is being done too.  No outpatient medications have been marked as taking for the 11/29/23 encounter (Appointment) with Gilda Crease, Latina Craver, MD.    Past Medical History:  Diagnosis Date   Colon polyp    Family history of B12 deficiency    Fatty liver    Hyperlipidemia    Hypertension    Hypokalemia    with diuretic use   Migraine    Ocular rosacea    Palpitation    due to caffeine    Past Surgical History:  Procedure Laterality Date   GALLBLADDER SURGERY     TONSILLECTOMY AND ADENOIDECTOMY     UTERINE ABLATION     WISDOM TOOTH EXTRACTION      Social History Social History   Tobacco Use   Smoking status: Never   Smokeless tobacco: Never  Substance Use Topics   Alcohol use: No   Drug use: No    Family History Family History  Problem Relation Age of Onset   Renal cancer Mother    Breast cancer Mother 29   Cancer Mother    Stroke Father    Breast cancer Paternal Grandmother    Colon cancer Cousin     Allergies  Allergen Reactions   Ace Inhibitors Other (See Comments)    H/o leg cramping with ACE use.    Hydrochlorothiazide Other (See Comments)    Risk of hypokalemia- prev hospitalized.     Lasix [Furosemide] Other (See Comments)    Risk of hypokalemia- prev hospitalized.   Pamabrom  Other (See Comments)    Low potassium   Zyrtec [Cetirizine] Other (See Comments)    Worsening dry eyes     REVIEW OF SYSTEMS (Negative unless checked)  Constitutional: [] Weight loss  [] Fever  [] Chills Cardiac: [] Chest pain   [] Chest pressure   [] Palpitations   [] Shortness of breath when laying flat   [] Shortness of breath with exertion. Vascular:  [] Pain in legs with walking   [x] Pain in legs with standing  [] History of DVT   [] Phlebitis   [x] Swelling in legs   [] Varicose veins   [] Non-healing ulcers Pulmonary:   [] Uses home oxygen   [] Productive cough   [] Hemoptysis   [] Wheeze  [] COPD   [] Asthma Neurologic:  [] Dizziness   [] Seizures   [] History of stroke   [] History of TIA  [] Aphasia   [] Vissual changes   [] Weakness or numbness in arm   [] Weakness or numbness in leg Musculoskeletal:   [] Joint swelling   [] Joint pain   [] Low back pain Hematologic:  [] Easy bruising  [] Easy bleeding   [] Hypercoagulable state   [] Anemic Gastrointestinal:  [] Diarrhea   [] Vomiting  [] Gastroesophageal reflux/heartburn   [] Difficulty swallowing. Genitourinary:  [] Chronic kidney disease   []   Difficult urination  [] Frequent urination   [] Blood in urine Skin:  [] Rashes   [] Ulcers  Psychological:  [] History of anxiety   []  History of major depression.  Physical Examination  There were no vitals filed for this visit. There is no height or weight on file to calculate BMI. Gen: WD/WN, NAD Head: [place]/AT, No temporalis wasting.  Ear/Nose/Throat: Hearing grossly intact, nares w/o erythema or drainage, pinna without lesions Eyes: PER, EOMI, sclera nonicteric.  Neck: Supple, no gross masses.  No JVD.  Pulmonary:  Good air movement, no audible wheezing, no use of accessory muscles.  Cardiac: RRR, precordium not hyperdynamic. Vascular:  scattered varicosities present bilaterally.  Mild venous stasis changes to the legs bilaterally.  3-4+ soft pitting edema, CEAP [id number]  Vessel Right Left  Radial Palpable Palpable   Gastrointestinal: soft, non-distended. No guarding/no peritoneal signs.  Musculoskeletal: M/S 5/5 throughout.  No deformity.  Neurologic: CN 2-12 intact. Pain and light touch intact in extremities.  Symmetrical.  Speech is fluent. Motor exam as listed above. Psychiatric: Judgment intact, Mood & affect appropriate for pt's clinical situation. Dermatologic: Venous rashes no ulcers noted.  No changes consistent with cellulitis. Lymph : No lichenification or skin changes of chronic lymphedema.  CBC Lab Results  Component Value Date   WBC 8.8 06/09/2022   HGB 13.8 06/09/2022   HCT 41.2 06/09/2022   MCV 89.4 06/09/2022   PLT 284.0 06/09/2022    BMET    Component Value Date/Time   NA 140 06/28/2023 1026   NA 139 12/31/2011 2210   K 4.0 06/28/2023 1026   K 3.6 12/31/2011 2210   CL 105 06/28/2023 1026   CL 106 12/31/2011 2210   CO2 29 06/28/2023 1026   CO2 27 12/31/2011 2210   GLUCOSE 90 06/28/2023 1026   GLUCOSE 131 (H) 12/31/2011 2210   BUN 18 06/28/2023 1026   BUN 14 12/31/2011 2210   CREATININE 0.80 06/28/2023 1026   CREATININE 0.72 09/17/2017 1454   CALCIUM 9.6 06/28/2023 1026   CALCIUM 8.9 12/31/2011 2210   GFRNONAA >60 12/31/2011 2210   GFRAA >60 12/31/2011 2210   CrCl cannot be calculated (Patient's most recent lab result is older than the maximum 21 days allowed.).  COAG No results found for: "INR", "PROTIME"  Radiology No results found.   Assessment/Plan There are no diagnoses linked to this encounter.   Levora Dredge, MD  11/28/2023 4:10 PM

## 2023-11-29 ENCOUNTER — Encounter (INDEPENDENT_AMBULATORY_CARE_PROVIDER_SITE_OTHER): Payer: Self-pay | Admitting: Vascular Surgery

## 2023-11-29 ENCOUNTER — Ambulatory Visit (INDEPENDENT_AMBULATORY_CARE_PROVIDER_SITE_OTHER): Payer: BC Managed Care – PPO | Admitting: Vascular Surgery

## 2023-11-29 ENCOUNTER — Ambulatory Visit (INDEPENDENT_AMBULATORY_CARE_PROVIDER_SITE_OTHER): Payer: BC Managed Care – PPO

## 2023-11-29 VITALS — BP 129/81 | HR 71 | Resp 18 | Ht 67.0 in | Wt 194.4 lb

## 2023-11-29 DIAGNOSIS — I10 Essential (primary) hypertension: Secondary | ICD-10-CM

## 2023-11-29 DIAGNOSIS — E782 Mixed hyperlipidemia: Secondary | ICD-10-CM | POA: Diagnosis not present

## 2023-11-29 DIAGNOSIS — I872 Venous insufficiency (chronic) (peripheral): Secondary | ICD-10-CM | POA: Diagnosis not present

## 2023-11-29 DIAGNOSIS — I8310 Varicose veins of unspecified lower extremity with inflammation: Secondary | ICD-10-CM

## 2023-11-29 DIAGNOSIS — I890 Lymphedema, not elsewhere classified: Secondary | ICD-10-CM

## 2023-11-30 ENCOUNTER — Ambulatory Visit: Admitting: Family Medicine

## 2023-11-30 ENCOUNTER — Encounter: Payer: Self-pay | Admitting: Family Medicine

## 2023-11-30 VITALS — BP 142/82 | HR 73 | Temp 99.4°F | Ht 67.0 in | Wt 195.8 lb

## 2023-11-30 DIAGNOSIS — M7660 Achilles tendinitis, unspecified leg: Secondary | ICD-10-CM

## 2023-11-30 NOTE — Patient Instructions (Addendum)
 I would try icing whenever possible for 5 minutes at a time and try using an extra layer in the heel lift.  Change to running shoes instead of flats.   Stop the diclofenac tabs and change to diclofenac gel up to 4 times a day.  Let me see about options re: prednisone in the meantime.  Take care.  Glad to see you.

## 2023-11-30 NOTE — Progress Notes (Unsigned)
 B foot pain. Initially in L ankle, now in B ankles for the last 6 weeks.  Achilles area pain.  She is using heel pads, d/w pt about using an extra layer for extra lift.  She has been icing at baseline- that helps some.  She is taking diclofenac at baseline.    Meds, vitals, and allergies reviewed.   ROS: Per HPI unless specifically indicated in ROS section   nad She has intact plantar and dorsiflexion bilaterally.  Medial and lateral malleolus are nontender.  Ankle joint itself is not tender.  Calcaneus is not tender but she has tenderness along the bilateral Achilles without erythema.  Skin well-perfused.  Able to bear weight.  Similar exam at both ankles.

## 2023-12-01 NOTE — Assessment & Plan Note (Signed)
 I would try icing whenever possible for 5 minutes at a time and try using an extra layer in the heel lift.  Change to running shoes instead of flats.   Stop the diclofenac tabs and change to diclofenac gel up to 4 times a day.  I asked her to update me later in the week.  I would not start oral prednisone at this point.

## 2023-12-03 ENCOUNTER — Telehealth: Payer: Self-pay | Admitting: *Deleted

## 2023-12-03 MED ORDER — PREDNISONE 20 MG PO TABS: ORAL_TABLET | ORAL | 0 refills | Status: DC

## 2023-12-03 NOTE — Telephone Encounter (Signed)
 Copied from CRM [id number]. Topic: Clinical - Medication Question >> Dec 03, 2023  1:05 PM Yolanda Delgado wrote: Reason for CRM: Patient was seen on 11/30/2023 due to achilles pain and was told to call back in case she needed prednisone. Patient would like for the prescription to be sent to the pharmacy.

## 2023-12-03 NOTE — Addendum Note (Signed)
 Addended by: Joaquim Nam on: 12/03/2023 05:21 PM   Modules accepted: Orders

## 2024-01-11 ENCOUNTER — Encounter (INDEPENDENT_AMBULATORY_CARE_PROVIDER_SITE_OTHER): Payer: Self-pay

## 2024-03-08 ENCOUNTER — Telehealth (INDEPENDENT_AMBULATORY_CARE_PROVIDER_SITE_OTHER): Payer: Self-pay | Admitting: Vascular Surgery

## 2024-03-08 NOTE — Telephone Encounter (Signed)
 Pt called stating that her mother is very sick and she will need to cancel laser ablation appt for tomorrow 7.17.25 with Dr. Jama. Pt will call back when ready to R/S. Nothing further needed at this time.

## 2024-03-09 ENCOUNTER — Other Ambulatory Visit (INDEPENDENT_AMBULATORY_CARE_PROVIDER_SITE_OTHER): Admitting: Vascular Surgery

## 2024-03-15 DIAGNOSIS — L538 Other specified erythematous conditions: Secondary | ICD-10-CM | POA: Diagnosis not present

## 2024-03-15 DIAGNOSIS — L218 Other seborrheic dermatitis: Secondary | ICD-10-CM | POA: Diagnosis not present

## 2024-03-15 DIAGNOSIS — D485 Neoplasm of uncertain behavior of skin: Secondary | ICD-10-CM | POA: Diagnosis not present

## 2024-03-15 DIAGNOSIS — L718 Other rosacea: Secondary | ICD-10-CM | POA: Diagnosis not present

## 2024-03-15 DIAGNOSIS — R208 Other disturbances of skin sensation: Secondary | ICD-10-CM | POA: Diagnosis not present

## 2024-03-15 DIAGNOSIS — D2261 Melanocytic nevi of right upper limb, including shoulder: Secondary | ICD-10-CM | POA: Diagnosis not present

## 2024-03-15 DIAGNOSIS — D2262 Melanocytic nevi of left upper limb, including shoulder: Secondary | ICD-10-CM | POA: Diagnosis not present

## 2024-03-15 DIAGNOSIS — D225 Melanocytic nevi of trunk: Secondary | ICD-10-CM | POA: Diagnosis not present

## 2024-03-16 ENCOUNTER — Encounter (INDEPENDENT_AMBULATORY_CARE_PROVIDER_SITE_OTHER)

## 2024-04-06 ENCOUNTER — Ambulatory Visit (INDEPENDENT_AMBULATORY_CARE_PROVIDER_SITE_OTHER): Admitting: Nurse Practitioner

## 2024-05-30 ENCOUNTER — Telehealth (INDEPENDENT_AMBULATORY_CARE_PROVIDER_SITE_OTHER): Payer: Self-pay

## 2024-05-30 NOTE — Telephone Encounter (Signed)
 Patient was notified with medical recommendations and stated that she would like to move forward with scheduling follow up appt. Patient was informed that the front receptionist will give a call to schedule appt.

## 2024-05-30 NOTE — Telephone Encounter (Signed)
 Spoke with patient to make sure she was wanting to try and do the laser ablation procedure this year. I advised of insurance companies possibly changing requirements on a quarterly basis and if she is wanting to wait until January then I would not be able to get the prior auth until January. I also advised the days that Dr. Jama performs the procedures and the fact that MOST insurance companies require an ultrasound within 6 months of the procedure. Answered all questions and patient states that she will wait until January and call me back when she is ready to proceed with either an appt to redo US  and visit or a prior auth for the laser procedure.

## 2024-05-30 NOTE — Telephone Encounter (Signed)
 Patient left a message stating on yesterday she started having on and off pain with varicose vein on the left leg. Patient has no swelling or redness to the area. Patient was last seen on 11/29/23 in the office and was scheduled for laser ablation on different date but had to cancel due too circumstances. Patient is requesting to follow up in the office. Please Advise

## 2024-05-30 NOTE — Telephone Encounter (Signed)
 She can follow up or she can reschedule her laser.

## 2024-06-27 ENCOUNTER — Other Ambulatory Visit: Payer: Self-pay | Admitting: Family Medicine

## 2024-06-27 NOTE — Telephone Encounter (Signed)
 Please review for medication refill.   Last OV 11/30/2023  Last written on 06/19/23 #10 with 5 refills  No follow-up scheduled at this time.

## 2024-07-15 ENCOUNTER — Other Ambulatory Visit: Payer: Self-pay | Admitting: Family Medicine

## 2024-07-15 DIAGNOSIS — I10 Essential (primary) hypertension: Secondary | ICD-10-CM

## 2024-07-17 NOTE — Telephone Encounter (Signed)
 E-scribed refill.  Pls schedule CPE and fasting labs for additional refills.

## 2024-07-23 ENCOUNTER — Other Ambulatory Visit: Payer: Self-pay | Admitting: Family Medicine

## 2024-07-23 DIAGNOSIS — I10 Essential (primary) hypertension: Secondary | ICD-10-CM

## 2024-07-28 ENCOUNTER — Other Ambulatory Visit

## 2024-08-03 ENCOUNTER — Other Ambulatory Visit

## 2024-08-03 DIAGNOSIS — I10 Essential (primary) hypertension: Secondary | ICD-10-CM

## 2024-08-03 LAB — LIPID PANEL
Cholesterol: 226 mg/dL — ABNORMAL HIGH (ref 0–200)
HDL: 60 mg/dL (ref >39.00)
LDL Cholesterol: 137 mg/dL — ABNORMAL HIGH (ref 0–99)
NonHDL: 165.63
Total CHOL/HDL Ratio: 4
Triglycerides: 144 mg/dL (ref 0.0–149.0)
VLDL: 28.8 mg/dL (ref 0.0–40.0)

## 2024-08-03 LAB — COMPREHENSIVE METABOLIC PANEL WITH GFR
ALT: 27 U/L (ref 0–35)
AST: 22 U/L (ref 0–37)
Albumin: 4.7 g/dL (ref 3.5–5.2)
Alkaline Phosphatase: 63 U/L (ref 39–117)
BUN: 20 mg/dL (ref 6–23)
CO2: 30 meq/L (ref 19–32)
Calcium: 10 mg/dL (ref 8.4–10.5)
Chloride: 104 meq/L (ref 96–112)
Creatinine, Ser: 0.7 mg/dL (ref 0.40–1.20)
GFR: 90.19 mL/min (ref >60.00)
Glucose, Bld: 92 mg/dL (ref 70–99)
Potassium: 3.7 meq/L (ref 3.5–5.1)
Sodium: 141 meq/L (ref 135–145)
Total Bilirubin: 1.2 mg/dL (ref 0.2–1.2)
Total Protein: 7 g/dL (ref 6.0–8.3)

## 2024-08-04 ENCOUNTER — Encounter: Admitting: Family Medicine

## 2024-08-06 ENCOUNTER — Ambulatory Visit: Payer: Self-pay | Admitting: Family Medicine

## 2024-08-08 ENCOUNTER — Encounter: Payer: Self-pay | Admitting: Family Medicine

## 2024-08-08 ENCOUNTER — Ambulatory Visit: Admitting: Family Medicine

## 2024-08-08 VITALS — BP 122/86 | HR 80 | Temp 98.4°F | Ht 65.75 in | Wt 190.4 lb

## 2024-08-08 DIAGNOSIS — I10 Essential (primary) hypertension: Secondary | ICD-10-CM

## 2024-08-08 DIAGNOSIS — Z0000 Encounter for general adult medical examination without abnormal findings: Secondary | ICD-10-CM | POA: Diagnosis not present

## 2024-08-08 MED ORDER — LOSARTAN POTASSIUM 50 MG PO TABS: 50.0000 mg | ORAL_TABLET | Freq: Every day | ORAL | 3 refills | Status: AC

## 2024-08-08 MED ORDER — SUMATRIPTAN SUCCINATE 50 MG PO TABS: ORAL_TABLET | ORAL | 12 refills | Status: AC

## 2024-08-08 MED ORDER — PREDNISONE 10 MG PO TABS: ORAL_TABLET | ORAL | 0 refills | Status: AC

## 2024-08-08 MED ORDER — DICLOFENAC SODIUM 75 MG PO TBEC: 75.0000 mg | DELAYED_RELEASE_TABLET | Freq: Two times a day (BID) | ORAL | 1 refills | Status: AC

## 2024-08-08 MED ORDER — AMLODIPINE BESYLATE 5 MG PO TABS: 5.0000 mg | ORAL_TABLET | Freq: Every day | ORAL | 3 refills | Status: AC

## 2024-08-08 NOTE — Patient Instructions (Addendum)
 You can call for a mammogram at [facility] at [facility].  [address](second floor) [employer] [phone]   Take care.  Glad to see you.  If prednisone  doesn't help your voice, then let us  know so we can set up ENT referral. Try to rest your voice.
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
## 2024-08-09 DIAGNOSIS — R490 Dysphonia: Secondary | ICD-10-CM | POA: Insufficient documentation

## 2024-08-09 NOTE — Assessment & Plan Note (Signed)
 Continue work on diet and exercise.  Continue amlodipine  and losartan .

## 2024-08-09 NOTE — Assessment & Plan Note (Deleted)
 H/o. Used diclofenac  prn, joint pain improved with turmeric. Continue as is.

## 2024-08-09 NOTE — Assessment & Plan Note (Signed)
 Should resolve.  Can start prednisone  in the meantime with routine steroid cautions.  If she does not improve then she can let us  know and we can refer to ENT.

## 2024-08-09 NOTE — Assessment & Plan Note (Signed)
 Tetanus 2017 Flu d/w pt.   PNA due at 67 shingrix d/w pt. covid 2021 Mammogram 2024, d/w pt about follow up 2025.  DXA wnl 2024 Colonoscopy 2023 Pap d/w pt.  Prev done, no h/o abnomals.   Husband designated if patient were incapacitated.   Would defer vaccines today until her voice is back to baseline.

## 2024-08-09 NOTE — Assessment & Plan Note (Addendum)
 Taking imitrex  rarely, no recent use.  Imitrex  helps when used.  Continue.  Continue as needed use.

## 2024-08-09 NOTE — Assessment & Plan Note (Signed)
 H/o. Used diclofenac  prn, joint pain improved with turmeric. Continue as is.

## 2024-08-09 NOTE — Assessment & Plan Note (Signed)
Husband designated if patient were incapacitated. 

## 2024-08-11 ENCOUNTER — Encounter: Admitting: Family Medicine

## 2024-09-18 ENCOUNTER — Other Ambulatory Visit: Payer: Self-pay | Admitting: Physician Assistant

## 2024-09-18 DIAGNOSIS — S82851A Displaced trimalleolar fracture of right lower leg, initial encounter for closed fracture: Secondary | ICD-10-CM

## 2024-09-19 ENCOUNTER — Ambulatory Visit
Admission: RE | Admit: 2024-09-19 | Discharge: 2024-09-19 | Disposition: A | Discharge status: Home / Self Care | Source: Ambulatory Visit | Attending: Physician Assistant | Admitting: Physician Assistant

## 2024-09-19 DIAGNOSIS — S82851A Displaced trimalleolar fracture of right lower leg, initial encounter for closed fracture: Secondary | ICD-10-CM | POA: Diagnosis present
